# Patient Record
Sex: Male | Born: 1965
Health system: Southern US, Community
[De-identification: ages and names within clinical notes are randomized; demographics above are authoritative.]

## PROBLEM LIST (undated history)

## (undated) ENCOUNTER — Ambulatory Visit

## (undated) ENCOUNTER — Encounter

## (undated) DIAGNOSIS — M1990 Unspecified osteoarthritis, unspecified site: Secondary | ICD-10-CM

## (undated) DIAGNOSIS — F419 Anxiety disorder, unspecified: Secondary | ICD-10-CM

## (undated) DIAGNOSIS — Z9989 Dependence on other enabling machines and devices: Secondary | ICD-10-CM

## (undated) DIAGNOSIS — E785 Hyperlipidemia, unspecified: Secondary | ICD-10-CM

## (undated) DIAGNOSIS — G4733 Obstructive sleep apnea (adult) (pediatric): Secondary | ICD-10-CM

## (undated) DIAGNOSIS — T7840XA Allergy, unspecified, initial encounter: Secondary | ICD-10-CM

## (undated) HISTORY — DX: Unspecified osteoarthritis, unspecified site: M19.90

## (undated) HISTORY — DX: Dependence on other enabling machines and devices: Z99.89

## (undated) HISTORY — DX: Anxiety disorder, unspecified: F41.9

## (undated) HISTORY — DX: Hyperlipidemia, unspecified: E78.5

## (undated) HISTORY — DX: Allergy, unspecified, initial encounter: T78.40XA

## (undated) HISTORY — DX: Obstructive sleep apnea (adult) (pediatric): G47.33

## (undated) HISTORY — PX: VASECTOMY: SHX75

---
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
## 2011-11-28 ENCOUNTER — Other Ambulatory Visit: Payer: Self-pay | Admitting: Family Medicine

## 2011-11-28 MED ORDER — ATORVASTATIN CALCIUM 40 MG PO TABS: 40.0000 mg | ORAL_TABLET | Freq: Every day | ORAL | Status: DC

## 2011-12-01 ENCOUNTER — Other Ambulatory Visit: Payer: Self-pay | Admitting: *Deleted

## 2011-12-01 MED ORDER — ATORVASTATIN CALCIUM 40 MG PO TABS: 40.0000 mg | ORAL_TABLET | Freq: Every day | ORAL | Status: DC

## 2012-01-04 ENCOUNTER — Other Ambulatory Visit: Payer: Self-pay | Admitting: Emergency Medicine

## 2012-02-01 ENCOUNTER — Other Ambulatory Visit: Payer: Self-pay | Admitting: Physician Assistant

## 2012-06-12 ENCOUNTER — Ambulatory Visit (INDEPENDENT_AMBULATORY_CARE_PROVIDER_SITE_OTHER): Payer: BC Managed Care – PPO | Admitting: Emergency Medicine

## 2012-06-12 ENCOUNTER — Encounter: Payer: Self-pay | Admitting: Emergency Medicine

## 2012-06-12 ENCOUNTER — Ambulatory Visit: Payer: BC Managed Care – PPO

## 2012-06-12 VITALS — BP 120/90 | HR 69 | Temp 98.0°F | Resp 16 | Ht 72.75 in | Wt 227.0 lb

## 2012-06-12 DIAGNOSIS — E785 Hyperlipidemia, unspecified: Secondary | ICD-10-CM

## 2012-06-12 DIAGNOSIS — Z79899 Other long term (current) drug therapy: Secondary | ICD-10-CM

## 2012-06-12 DIAGNOSIS — Z125 Encounter for screening for malignant neoplasm of prostate: Secondary | ICD-10-CM

## 2012-06-12 DIAGNOSIS — S3994XA Unspecified injury of external genitals, initial encounter: Secondary | ICD-10-CM

## 2012-06-12 DIAGNOSIS — Z23 Encounter for immunization: Secondary | ICD-10-CM

## 2012-06-12 DIAGNOSIS — Z0000 Encounter for general adult medical examination without abnormal findings: Secondary | ICD-10-CM

## 2012-06-12 DIAGNOSIS — S3993XA Unspecified injury of pelvis, initial encounter: Secondary | ICD-10-CM

## 2012-06-12 DIAGNOSIS — S39848A Other specified injuries of external genitals, initial encounter: Secondary | ICD-10-CM

## 2012-06-12 LAB — COMPREHENSIVE METABOLIC PANEL
AST: 23 U/L (ref 0–37)
Albumin: 4.9 g/dL (ref 3.5–5.2)
Alkaline Phosphatase: 73 U/L (ref 39–117)
BUN: 16 mg/dL (ref 6–23)
Creat: 1.02 mg/dL (ref 0.50–1.35)
Potassium: 4.3 mEq/L (ref 3.5–5.3)

## 2012-06-12 LAB — LIPID PANEL
HDL: 37 mg/dL — ABNORMAL LOW (ref >39)
LDL Cholesterol: 96 mg/dL (ref 0–99)
Total CHOL/HDL Ratio: 4.3 Ratio
Triglycerides: 134 mg/dL (ref <150)
VLDL: 27 mg/dL (ref 0–40)

## 2012-06-12 LAB — POCT URINALYSIS DIPSTICK
Bilirubin, UA: NEGATIVE
Ketones, UA: NEGATIVE
Leukocytes, UA: NEGATIVE
Protein, UA: NEGATIVE

## 2012-06-12 LAB — POCT UA - MICROSCOPIC ONLY
Crystals, Ur, HPF, POC: NEGATIVE
WBC, Ur, HPF, POC: NEGATIVE

## 2012-06-12 LAB — CBC
HCT: 46.5 % (ref 39.0–52.0)
MCHC: 34.8 g/dL (ref 30.0–36.0)
RDW: 13.4 % (ref 11.5–15.5)

## 2012-06-12 LAB — IFOBT (OCCULT BLOOD): IFOBT: NEGATIVE

## 2012-06-12 NOTE — Progress Notes (Signed)
@[facility] @  Patient ID: Stephen Obrien MRN: [id number], DOB: 09/15/1965 46 y.o. Date of Encounter: 06/12/2012, 8:01 AM  Primary Physician: No primary provider on file.  Chief Complaint: Physical (CPE)  HPI: 46 y.o. y/o male with history noted below here for CPE.  Doing well. No issues/complaints.  Review of Systems:  Consitutional: No fever, chills, fatigue, night sweats, lymphadenopathy, or weight changes. Eyes: No visual changes, eye redness, or discharge. ENT/Mouth: Ears: No otalgia, tinnitus, hearing loss, discharge. Nose: No congestion, rhinorrhea, sinus pain, or epistaxis. Throat: No sore throat, post nasal drip, or teeth pain. Cardiovascular: No CP, palpitations, diaphoresis, DOE, edema, orthopnea, PND. Respiratory: No cough, hemoptysis, SOB, or wheezing. Gastrointestinal: No anorexia, dysphagia, reflux, pain, nausea, vomiting, hematemesis, diarrhea, constipation, BRBPR, or melena. Genitourinary: No dysuria, frequency, urgency, hematuria, incontinence, nocturia, decreased urinary stream, discharge, impotence, or testicular pain/masses. Musculoskeletal: He has persistent pain in his right  groin following a soccer injury one year ago. He is limited in his activity but he can bicycle but just cannot run   Skin: No rash, erythema, lesion changes, pain, warmth, jaundice, or pruritis. Neurological: No headache, dizziness, syncope, seizures, tremors, memory loss, coordination problems, or paresthesias. Psychological: No anxiety, depression, hallucinations, SI/HI. Endocrine: No fatigue, polydipsia, polyphagia, polyuria, or known diabetes. All other systems were reviewed and are otherwise negative.  No past medical history on file.   No past surgical history on file.  Home Meds:  Prior to Admission medications   Medication Sig Start Date End Date Taking? Authorizing Provider  atorvastatin (LIPITOR) 40 MG tablet Take 1 tablet (40 mg total) by mouth daily. 12/01/11  Yes Anders Simmonds, PA-C  cetirizine (ZYRTEC) 5 MG tablet Take 5 mg by mouth daily.   Yes Historical Provider, MD  fexofenadine (ALLEGRA) 180 MG tablet Take 180 mg by mouth daily.   Yes Historical Provider, MD  fish oil-omega-3 fatty acids 1000 MG capsule Take 2 g by mouth daily.   Yes Historical Provider, MD  glucosamine-chondroitin 500-400 MG tablet Take 1 tablet by mouth 3 (three) times daily.   Yes Historical Provider, MD  zolpidem (AMBIEN) 10 MG tablet TAKE 1 TABLET BY MOUTH AT BEDTIME 02/01/12  Yes Ryan M Dunn, PA-C    Allergies: No Known Allergies  History   Social History  . Marital Status: Married    Spouse Name: N/A    Number of Children: N/A  . Years of Education: N/A   Occupational History  . Not on file.   Social History Main Topics  . Smoking status: Never Smoker   . Smokeless tobacco: Not on file  . Alcohol Use: Yes  . Drug Use: No  . Sexually Active: Not on file   Other Topics Concern  . Not on file   Social History Narrative  . No narrative on file    No family history on file.  Physical Exam:  Blood pressure 120/90, pulse 69, temperature 98 F (36.7 C), temperature source Oral, resp. rate 16, height 6' 0.75" (1.848 m), weight 227 lb (102.967 kg), SpO2 97.00%.  General: Well developed, well nourished, in no acute distress. HEENT: Normocephalic, atraumatic. Conjunctiva pink, sclera non-icteric. Pupils 2 mm constricting to 1 mm, round, regular, and equally reactive to light and accomodation. EOMI. Internal auditory canal clear. TMs with good cone of light and without pathology. Nasal mucosa pink. Nares are without discharge. No sinus tenderness. Oral mucosa pink. Dentition normal. Pharynx without exudate.   Neck: Supple. Trachea midline. No thyromegaly. Full ROM. No lymphadenopathy.  Lungs: Clear to auscultation bilaterally without wheezes, rales, or rhonchi. Breathing is of normal effort and unlabored. Cardiovascular: RRR with S1 S2. No murmurs, rubs, or gallops  appreciated. Distal pulses 2+ symmetrically. No carotid or abdominal bruits. Abdomen: Soft, non-tender, non-distended with normoactive bowel sounds. No hepatosplenomegaly or masses. No rebound/guarding. No CVA tenderness. Without hernias.  Rectal: No external hemorrhoids or fissures. Rectal vault without masses. Prostate exam is normal  Genitourinary:  circumcised male. No penile lesions. Testes descended bilaterally, and smooth without tenderness or masses.  Musculoskeletal: Full range of motion and 5/5 strength throughout. Without swelling, atrophy, tenderness, crepitus, or warmth. Extremities without clubbing, cyanosis, or edema. Calves supple. Skin: Warm and moist without erythema, ecchymosis, wounds, or rash. Neuro: A+Ox3. CN II-XII grossly intact. Moves all extremities spontaneously. Full sensation throughout. Normal gait. DTR 2+ throughout upper and lower extremities. Finger to nose intact. Psych:  Responds to questions appropriately with a normal affect.  [facility] reading (PRIMARY) by  Dr.Tannie Koskela is a radiolucent area through the head of the femur but the patient's injury was one year ago and he has normal range of motion of the right hip I do not see any other abnormal areas EKG   Studies: CBC, CMET, Lipid, PSA, TSH, EKG and pelvic films   Results for orders placed in visit on 06/12/12  POCT UA - MICROSCOPIC ONLY      Component Value Range   WBC, Ur, HPF, POC neg     RBC, urine, microscopic 0-4     Bacteria, U Microscopic neg     Mucus, UA trace     Epithelial cells, urine per micros 0-1     Crystals, Ur, HPF, POC neg     Casts, Ur, LPF, POC neg     Yeast, UA neg    POCT URINALYSIS DIPSTICK      Component Value Range   Color, UA yellow     Clarity, UA clear     Glucose, UA neg     Bilirubin, UA neg     Ketones, UA neg     Spec Grav, UA 1.025     Blood, UA small     pH, UA 5.5     Protein, UA neg     Urobilinogen, UA 0.2     Nitrite, UA neg     Leukocytes, UA Negative    IFOBT  (OCCULT BLOOD)      Component Value Range   IFOBT Negative      Assessment/Plan:  46 y.o. white male in good health here for physical exam. Last year he saw Dr. Jacinto Halim and had a good cardiovascular workup. We'll check routine labs. Also check films of his pelvis. Okay to refill medications if patient calls. He will see how he does with reinstituting yoga and if he has problems would advise referral to physical therapy. Flu shot was given.      -  Signed, Earl Lites, MD 06/12/2012 8:01 AM

## 2012-06-13 ENCOUNTER — Telehealth: Payer: Self-pay | Admitting: Emergency Medicine

## 2012-06-21 NOTE — Telephone Encounter (Signed)
Erroneous encounter

## 2012-07-22 ENCOUNTER — Other Ambulatory Visit: Payer: Self-pay | Admitting: Physician Assistant

## 2012-07-24 ENCOUNTER — Encounter: Payer: Self-pay | Admitting: Emergency Medicine

## 2012-08-20 ENCOUNTER — Other Ambulatory Visit: Payer: Self-pay | Admitting: Physician Assistant

## 2012-09-27 ENCOUNTER — Telehealth: Payer: Self-pay

## 2012-09-27 NOTE — Telephone Encounter (Signed)
Pt needs refill on prescription Lipitor . [facility] [phone] please let pt know when done

## 2012-09-28 MED ORDER — ATORVASTATIN CALCIUM 40 MG PO TABS: 40.0000 mg | ORAL_TABLET | Freq: Every day | ORAL | Status: DC

## 2012-09-28 NOTE — Telephone Encounter (Signed)
Called pt to advise this is done

## 2013-01-22 ENCOUNTER — Other Ambulatory Visit: Payer: Self-pay | Admitting: Physician Assistant

## 2013-01-22 ENCOUNTER — Other Ambulatory Visit: Payer: Self-pay | Admitting: Emergency Medicine

## 2013-01-24 ENCOUNTER — Other Ambulatory Visit: Payer: Self-pay | Admitting: Radiology

## 2013-01-24 NOTE — Telephone Encounter (Signed)
Called in the [place], Dr Cleta Alberts printed he is not here to sign

## 2013-05-27 ENCOUNTER — Other Ambulatory Visit: Payer: Self-pay | Admitting: Emergency Medicine

## 2013-05-27 NOTE — Telephone Encounter (Signed)
Pt has appt on 06/18/13. Do you want to RF until then? 1 mos pended.

## 2013-06-15 ENCOUNTER — Other Ambulatory Visit: Payer: Self-pay | Admitting: Physician Assistant

## 2013-06-18 ENCOUNTER — Encounter: Payer: BC Managed Care – PPO | Admitting: Emergency Medicine

## 2013-07-24 ENCOUNTER — Other Ambulatory Visit: Payer: Self-pay | Admitting: Physician Assistant

## 2013-07-25 NOTE — Telephone Encounter (Signed)
Pt has appt 09/03/13. Can we RF until then?

## 2013-09-03 ENCOUNTER — Encounter: Payer: BC Managed Care – PPO | Admitting: Emergency Medicine

## 2013-09-06 ENCOUNTER — Other Ambulatory Visit: Payer: Self-pay | Admitting: Emergency Medicine

## 2013-09-06 ENCOUNTER — Encounter: Payer: Self-pay | Admitting: Emergency Medicine

## 2013-09-06 ENCOUNTER — Ambulatory Visit (INDEPENDENT_AMBULATORY_CARE_PROVIDER_SITE_OTHER): Payer: BC Managed Care – PPO | Admitting: Emergency Medicine

## 2013-09-06 VITALS — BP 120/70 | HR 100 | Temp 98.0°F | Resp 16 | Ht 72.5 in | Wt 237.0 lb

## 2013-09-06 DIAGNOSIS — R1031 Right lower quadrant pain: Secondary | ICD-10-CM

## 2013-09-06 DIAGNOSIS — Z0000 Encounter for general adult medical examination without abnormal findings: Secondary | ICD-10-CM

## 2013-09-06 DIAGNOSIS — Z1211 Encounter for screening for malignant neoplasm of colon: Secondary | ICD-10-CM

## 2013-09-06 DIAGNOSIS — Z23 Encounter for immunization: Secondary | ICD-10-CM

## 2013-09-06 DIAGNOSIS — E785 Hyperlipidemia, unspecified: Secondary | ICD-10-CM | POA: Insufficient documentation

## 2013-09-06 DIAGNOSIS — E789 Disorder of lipoprotein metabolism, unspecified: Secondary | ICD-10-CM

## 2013-09-06 DIAGNOSIS — R748 Abnormal levels of other serum enzymes: Secondary | ICD-10-CM

## 2013-09-06 DIAGNOSIS — L500 Allergic urticaria: Secondary | ICD-10-CM | POA: Insufficient documentation

## 2013-09-06 LAB — CBC WITH DIFFERENTIAL/PLATELET
BASOS PCT: 0 % (ref 0–1)
Basophils Absolute: 0 10*3/uL (ref 0.0–0.1)
Eosinophils Absolute: 0.1 10*3/uL (ref 0.0–0.7)
Eosinophils Relative: 2 % (ref 0–5)
HCT: 50.3 % (ref 39.0–52.0)
Hemoglobin: 17.4 g/dL — ABNORMAL HIGH (ref 13.0–17.0)
Lymphocytes Relative: 42 % (ref 12–46)
Lymphs Abs: 2.3 10*3/uL (ref 0.7–4.0)
MCH: 30.2 pg (ref 26.0–34.0)
MCHC: 34.6 g/dL (ref 30.0–36.0)
MCV: 87.2 fL (ref 78.0–100.0)
MONO ABS: 0.7 10*3/uL (ref 0.1–1.0)
Monocytes Relative: 13 % — ABNORMAL HIGH (ref 3–12)
NEUTROS ABS: 2.3 10*3/uL (ref 1.7–7.7)
NEUTROS PCT: 43 % (ref 43–77)
PLATELETS: 254 10*3/uL (ref 150–400)
RBC: 5.77 MIL/uL (ref 4.22–5.81)
RDW: 13.2 % (ref 11.5–15.5)
WBC: 5.4 10*3/uL (ref 4.0–10.5)

## 2013-09-06 LAB — POCT URINALYSIS DIPSTICK
BILIRUBIN UA: NEGATIVE
Blood, UA: NEGATIVE
Glucose, UA: NEGATIVE
KETONES UA: NEGATIVE
LEUKOCYTES UA: NEGATIVE
Nitrite, UA: NEGATIVE
PROTEIN UA: NEGATIVE
SPEC GRAV UA: 1.01
Urobilinogen, UA: 0.2
pH, UA: 6.5

## 2013-09-06 LAB — LIPID PANEL
Cholesterol: 159 mg/dL (ref 0–200)
HDL: 36 mg/dL — AB (ref >39)
LDL Cholesterol: 102 mg/dL — ABNORMAL HIGH (ref 0–99)
Total CHOL/HDL Ratio: 4.4 Ratio
Triglycerides: 107 mg/dL (ref <150)
VLDL: 21 mg/dL (ref 0–40)

## 2013-09-06 LAB — COMPLETE METABOLIC PANEL WITH GFR
ALT: 66 U/L — ABNORMAL HIGH (ref 0–53)
AST: 31 U/L (ref 0–37)
Albumin: 5 g/dL (ref 3.5–5.2)
Alkaline Phosphatase: 80 U/L (ref 39–117)
BUN: 16 mg/dL (ref 6–23)
CO2: 25 mEq/L (ref 19–32)
Calcium: 9.9 mg/dL (ref 8.4–10.5)
Chloride: 104 mEq/L (ref 96–112)
Creat: 1.06 mg/dL (ref 0.50–1.35)
GFR, Est African American: >89 mL/min
GFR, Est Non African American: 83 mL/min
Glucose, Bld: 96 mg/dL (ref 70–99)
Potassium: 4.2 mEq/L (ref 3.5–5.3)
SODIUM: 138 meq/L (ref 135–145)
Total Bilirubin: 0.5 mg/dL (ref 0.2–1.2)
Total Protein: 7.6 g/dL (ref 6.0–8.3)

## 2013-09-06 LAB — PSA: PSA: 0.81 ng/mL (ref <=4.00)

## 2013-09-06 LAB — IFOBT (OCCULT BLOOD): IFOBT: POSITIVE

## 2013-09-06 LAB — TSH: TSH: 1.439 u[IU]/mL (ref 0.350–4.500)

## 2013-09-06 MED ORDER — FEXOFENADINE HCL 180 MG PO TABS: 180.0000 mg | ORAL_TABLET | Freq: Every day | ORAL | Status: DC

## 2013-09-06 MED ORDER — ZOLPIDEM TARTRATE 10 MG PO TABS: ORAL_TABLET | ORAL | Status: DC

## 2013-09-06 MED ORDER — LEVOCETIRIZINE DIHYDROCHLORIDE 5 MG PO TABS: 5.0000 mg | ORAL_TABLET | Freq: Every evening | ORAL | Status: DC

## 2013-09-06 MED ORDER — ATORVASTATIN CALCIUM 40 MG PO TABS: ORAL_TABLET | ORAL | Status: DC

## 2013-09-06 NOTE — Progress Notes (Signed)
@[facility] @  Patient ID: Stephen Obrien MRN: [id number], DOB: 10-29-1965 48 y.o. Date of Encounter: 09/06/2013, 10:17 AM  Primary Physician: Jenny Reichmann, MD  Chief Complaint: Physical (CPE)  HPI: 48 y.o. y/o male with history noted below here for CPE.  Doing well. Feeling pretty good.  Still has numbness in left side.  Still having chronic hives but takes allegra every morning.  Hives has been going on for ten years.  Contact pressure hives.  Sleep is still an issue.  Will take an [place] once or twice week.  Doesn't think he has sleep apnea.  Family history of cholesterol, stroke in great maternal grandparents.  Exercises on a bike once or twice a week.  Walks and carried bag when golfing.  Still having issues and would like to be referred to ortho.  Left ear is popping.    Review of Systems: Consitutional: No fever, chills, fatigue, night sweats, lymphadenopathy, or weight changes. Eyes: No visual changes, eye redness, or discharge. ENT/Mouth: Ears: No otalgia, tinnitus, hearing loss, discharge. Nose: No congestion, rhinorrhea, sinus pain, or epistaxis. Throat: No sore throat, post nasal drip, or teeth pain. Cardiovascular: No CP, palpitations, diaphoresis, DOE, edema, orthopnea, PND. Respiratory: No cough, hemoptysis, SOB, or wheezing. Gastrointestinal: No anorexia, dysphagia, reflux, pain, nausea, vomiting, hematemesis, diarrhea, constipation, BRBPR, or melena. Genitourinary: No dysuria, frequency, urgency, hematuria, incontinence, nocturia, decreased urinary stream, discharge, impotence, or testicular pain/masses. Musculoskeletal: No decreased ROM, myalgias, stiffness, joint swelling, or weakness. Skin: No rash, erythema, lesion changes, pain, warmth, jaundice, or pruritis.  Has an issue with chronic hives Neurological: No headache, dizziness, syncope, seizures, tremors, memory loss, coordination problems, or paresthesias. Psychological: No anxiety, depression, hallucinations,  SI/HI. Endocrine: No fatigue, polydipsia, polyphagia, polyuria, or known diabetes. All other systems were reviewed and are otherwise negative. Has seasonal allergies and minor food allergies.  Also has sleep disturbance.   Past Medical History  Diagnosis Date  . Allergy   . Arthritis      Past Surgical History  Procedure Laterality Date  . Vasectomy      Home Meds:  Prior to Admission medications   Medication Sig Start Date End Date Taking? Authorizing Provider  atorvastatin (LIPITOR) 40 MG tablet TAKE 1 TABLET BY MOUTH DAILY 07/24/13  Yes Ryan M Dunn, PA-C  fexofenadine (ALLEGRA) 180 MG tablet Take 180 mg by mouth daily.   Yes Historical Provider, MD  fish oil-omega-3 fatty acids 1000 MG capsule Take 2 g by mouth daily.   Yes Historical Provider, MD  glucosamine-chondroitin 500-400 MG tablet Take 1 tablet by mouth 3 (three) times daily.   Yes Historical Provider, MD  levocetirizine (XYZAL) 5 MG tablet Take 5 mg by mouth every evening.   Yes Historical Provider, MD  zolpidem (AMBIEN) 10 MG tablet TAKE 1 TABLET BY MOUTH EVERY NIGHT AT BEDTIME AS NEEDED 05/27/13  Yes [name], MD  cetirizine (ZYRTEC) 5 MG tablet Take 5 mg by mouth daily.    Historical Provider, MD    Allergies: No Known Allergies  History   Social History  . Marital Status: Married    Spouse Name: N/A    Number of Children: N/A  . Years of Education: N/A   Occupational History  . Not on file.   Social History Main Topics  . Smoking status: Never Smoker   . Smokeless tobacco: Not on file  . Alcohol Use: Yes  . Drug Use: No  . Sexual Activity: Not on file   Other Topics Concern  .  Not on file   Social History Narrative  . No narrative on file    Family History  Problem Relation Age of Onset  . Stroke Father   . Depression Sister     Physical Exam: Blood pressure 120/70, pulse 100, temperature 98 F (36.7 C), temperature source Oral, resp. rate 16, height 6' 0.5" (1.842 m), weight 237 lb  (107.502 kg), SpO2 98.00%.  General: Well developed, well nourished, in no acute distress. HEENT: Normocephalic, atraumatic. Conjunctiva pink, sclera non-icteric. Pupils 2 mm constricting to 1 mm, round, regular, and equally reactive to light and accomodation. EOMI. Internal auditory canal clear. TMs with good cone of light and without pathology. Nasal mucosa pink. Nares are without discharge. No sinus tenderness. Oral mucosa pink. Dentition. Pharynx without exudate.   Neck: Supple. Trachea midline. No thyromegaly. Full ROM. No lymphadenopathy. Lungs: Clear to auscultation bilaterally without wheezes, rales, or rhonchi. Breathing is of normal effort and unlabored. Cardiovascular: RRR with S1 S2. No murmurs, rubs, or gallops appreciated. Distal pulses 2+ symmetrically. No carotid or abdominal bruits. Abdomen: Soft, non-tender, non-distended with normoactive bowel sounds. No hepatosplenomegaly or masses. No rebound/guarding. No CVA tenderness. Without hernias.  Rectal: No external hemorrhoids or fissures. Rectal vault without masses.  Genitourinary:  circumcised male. No penile lesions. Testes descended bilaterally, and smooth without tenderness or masses.  Musculoskeletal: Full range of motion and 5/5 strength throughout. Without swelling, atrophy, tenderness, crepitus, or warmth. Extremities without clubbing, cyanosis, or edema. Calves supple. Skin: Warm and moist without erythema, ecchymosis, wounds, or rash. Neuro: A+Ox3. CN II-XII grossly intact. Moves all extremities spontaneously. Full sensation throughout. Normal gait. DTR 2+ throughout upper and lower extremities. Finger to nose intact. Psych:  Responds to questions appropriately with a normal affect.   Meds ordered this encounter  Medications  . DISCONTD: levocetirizine (XYZAL) 5 MG tablet    Sig: Take 5 mg by mouth every evening.  . zolpidem (AMBIEN) 10 MG tablet    Sig: TAKE 1 TABLET BY MOUTH EVERY NIGHT AT BEDTIME AS NEEDED     Dispense:  30 tablet    Refill:  5  . levocetirizine (XYZAL) 5 MG tablet    Sig: Take 1 tablet (5 mg total) by mouth every evening.    Dispense:  30 tablet    Refill:  11  . fexofenadine (ALLEGRA) 180 MG tablet    Sig: Take 1 tablet (180 mg total) by mouth daily.    Dispense:  30 tablet    Refill:  11  . atorvastatin (LIPITOR) 40 MG tablet    Sig: TAKE 1 TABLET BY MOUTH DAILY    Dispense:  30 tablet    Refill:  11   Assessment/Plan:  48 y.o. y/o enters for general checkup. He is actually doing very well. 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 Meds were refilled .  -  Signed, Nena Jordan, MD 09/06/2013 10:17 AM

## 2013-09-09 LAB — HEPATITIS C ANTIBODY: HCV Ab: NEGATIVE

## 2013-10-16 ENCOUNTER — Ambulatory Visit (INDEPENDENT_AMBULATORY_CARE_PROVIDER_SITE_OTHER): Payer: BC Managed Care – PPO | Admitting: Family Medicine

## 2013-10-16 VITALS — BP 118/78 | HR 81 | Temp 98.0°F | Resp 16 | Ht 72.75 in | Wt 232.0 lb

## 2013-10-16 DIAGNOSIS — M79609 Pain in unspecified limb: Secondary | ICD-10-CM

## 2013-10-16 DIAGNOSIS — S86809A Unspecified injury of other muscle(s) and tendon(s) at lower leg level, unspecified leg, initial encounter: Secondary | ICD-10-CM

## 2013-10-16 NOTE — Progress Notes (Signed)
° °  Subjective:    Patient ID: Stephen Obrien, male    DOB: 11-20-1965, 48 y.o.   MRN: [id number]  This chart was scribed for Robyn Haber, MD by Maree Erie, ED Scribe.   Chief Complaint  Patient presents with   possible muscle strain    right leg    PCP: DAUB, STEVE A, MD   HPI  Stephen Obrien is a 48 y.o. male who presents to office complaining of sudden onset right calf pain that occurred last night. 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 He states that on his first step he felt a pop and describes the sensation as a cork coming out of a champagne bottle. He has been unable to walk without significant pain to the right calf. He states that he iced the area with six hours last night and rested with mild to moderate improvement on the pain today. He is still unable to put bear weight normally on the right leg and is favoring the leg while walking. He denies noticing any bruising to the area.   His daughter is eleven currently. His mother was head nurse in the Labor and Delivery section.    Past Medical History  Diagnosis Date   Allergy    Arthritis     Review of Systems  Consitutional: No fever, chills, fatigue, night sweats, lymphadenopathy, or weight changes. Eyes: No visual changes, eye redness, or discharge. ENT/Mouth: Ears: No otalgia, tinnitus, hearing loss, discharge. Nose: No congestion, rhinorrhea, sinus pain, or epistaxis. Throat: No sore throat, post nasal drip, or teeth pain. Cardiovascular: No CP, palpitations, diaphoresis, DOE, edema, orthopnea, PND. Respiratory: No cough, hemoptysis, SOB, or wheezing. Gastrointestinal: No anorexia, dysphagia, reflux, pain, nausea, vomiting, hematemesis, diarrhea, constipation, BRBPR, or melena. Genitourinary: No dysuria, frequency, urgency, hematuria, incontinence, nocturia, decreased urinary stream, discharge, impotence, or testicular pain/masses. Musculoskeletal: Positive for right  calf pain, swelling Skin: No rash, erythema, lesion changes, pain, warmth, jaundice, or pruritis. Neurological: No headache, dizziness, syncope, seizures, tremors, memory loss, coordination problems, or paresthesias. Psychological: No anxiety, depression, hallucinations, SI/HI. Endocrine: No fatigue, polydipsia, polyphagia or polyuria. All other systems were reviewed and are otherwise negative.     Objective:   Physical Exam BP 118/78   Pulse 81   Temp(Src) 98 F (36.7 C) (Oral)   Resp 16   Ht 6' 0.75" (1.848 m)   Wt 232 lb (105.235 kg)   BMI 30.81 kg/m2   SpO2 95%  General: Well-developed, well-nourished male in no acute distress; appearance consistent with age of record HENT: normocephalic; atraumatic Eyes: pupils equal, round and reactive to light; extraocular muscles intact Neck: supple Heart: regular rate and rhythm; no murmurs, rubs or gallops Lungs: clear to auscultation bilaterally Abdomen: soft; nondistended; nontender; no masses or hepatosplenomegaly; bowel sounds present Extremities: No deformity; full range of motion; pulses normal Neurologic: Awake, alert and oriented; motor function intact in all extremities and symmetric; no facial droop Skin: Warm and dry Psychiatric: Normal mood and affect  Patient has a swollen right calf with tenderness on palpation. He has an antalgic gait but is able to bear weight     Assessment & Plan:   I personally performed the services described in this documentation, which was scribed in my presence. The recorded information has been reviewed and is accurate.  Injury of plantaris muscle or tendon Limited weight bearing x 4-6 weeks Signed, Robyn Haber, MD

## 2013-10-16 NOTE — Patient Instructions (Signed)

## 2013-12-10 ENCOUNTER — Other Ambulatory Visit (INDEPENDENT_AMBULATORY_CARE_PROVIDER_SITE_OTHER): Payer: BC Managed Care – PPO | Admitting: Radiology

## 2013-12-10 DIAGNOSIS — D751 Secondary polycythemia: Secondary | ICD-10-CM

## 2013-12-10 DIAGNOSIS — D45 Polycythemia vera: Secondary | ICD-10-CM

## 2013-12-10 LAB — HEPATIC FUNCTION PANEL
ALT: 33 U/L (ref 0–53)
AST: 21 U/L (ref 0–37)
Albumin: 4.7 g/dL (ref 3.5–5.2)
Alkaline Phosphatase: 73 U/L (ref 39–117)
BILIRUBIN INDIRECT: 0.6 mg/dL (ref 0.2–1.2)
BILIRUBIN TOTAL: 0.7 mg/dL (ref 0.2–1.2)
Bilirubin, Direct: 0.1 mg/dL (ref 0.0–0.3)
Total Protein: 7 g/dL (ref 6.0–8.3)

## 2013-12-10 LAB — CBC WITH DIFFERENTIAL/PLATELET
BASOS PCT: 0 % (ref 0–1)
Basophils Absolute: 0 10*3/uL (ref 0.0–0.1)
Eosinophils Absolute: 0 10*3/uL (ref 0.0–0.7)
Eosinophils Relative: 1 % (ref 0–5)
HEMATOCRIT: 44.9 % (ref 39.0–52.0)
Hemoglobin: 15.6 g/dL (ref 13.0–17.0)
LYMPHS ABS: 1.6 10*3/uL (ref 0.7–4.0)
Lymphocytes Relative: 35 % (ref 12–46)
MCH: 29.7 pg (ref 26.0–34.0)
MCHC: 34.7 g/dL (ref 30.0–36.0)
MCV: 85.5 fL (ref 78.0–100.0)
MONO ABS: 0.6 10*3/uL (ref 0.1–1.0)
MONOS PCT: 12 % (ref 3–12)
NEUTROS ABS: 2.4 10*3/uL (ref 1.7–7.7)
Neutrophils Relative %: 52 % (ref 43–77)
Platelets: 243 10*3/uL (ref 150–400)
RBC: 5.25 MIL/uL (ref 4.22–5.81)
RDW: 13 % (ref 11.5–15.5)
WBC: 4.7 10*3/uL (ref 4.0–10.5)

## 2013-12-10 LAB — SEDIMENTATION RATE: Sed Rate: 4 mm/hr (ref 0–16)

## 2013-12-11 ENCOUNTER — Telehealth: Payer: Self-pay | Admitting: *Deleted

## 2013-12-11 NOTE — Telephone Encounter (Signed)
Pt calling back in regards to lab results. Made him aware of results ; he expressed understanding .

## 2014-05-17 ENCOUNTER — Other Ambulatory Visit: Payer: Self-pay | Admitting: Emergency Medicine

## 2014-05-19 NOTE — Telephone Encounter (Signed)
Faxed

## 2014-07-23 ENCOUNTER — Other Ambulatory Visit: Payer: Self-pay | Admitting: Emergency Medicine

## 2014-09-07 ENCOUNTER — Other Ambulatory Visit: Payer: Self-pay | Admitting: Emergency Medicine

## 2014-09-09 ENCOUNTER — Encounter: Payer: Self-pay | Admitting: Emergency Medicine

## 2014-09-09 ENCOUNTER — Ambulatory Visit (INDEPENDENT_AMBULATORY_CARE_PROVIDER_SITE_OTHER): Payer: BLUE CROSS/BLUE SHIELD | Admitting: Emergency Medicine

## 2014-09-09 VITALS — BP 116/81 | HR 75 | Temp 98.0°F | Resp 16 | Ht 73.5 in | Wt 234.0 lb

## 2014-09-09 DIAGNOSIS — Z0000 Encounter for general adult medical examination without abnormal findings: Secondary | ICD-10-CM

## 2014-09-09 DIAGNOSIS — Z23 Encounter for immunization: Secondary | ICD-10-CM

## 2014-09-09 LAB — CBC WITH DIFFERENTIAL/PLATELET
BASOS ABS: 0 10*3/uL (ref 0.0–0.1)
Basophils Relative: 0 % (ref 0–1)
EOS ABS: 0.1 10*3/uL (ref 0.0–0.7)
EOS PCT: 2 % (ref 0–5)
HCT: 46.9 % (ref 39.0–52.0)
HEMOGLOBIN: 16.1 g/dL (ref 13.0–17.0)
LYMPHS PCT: 33 % (ref 12–46)
Lymphs Abs: 1.8 10*3/uL (ref 0.7–4.0)
MCH: 29.7 pg (ref 26.0–34.0)
MCHC: 34.3 g/dL (ref 30.0–36.0)
MCV: 86.4 fL (ref 78.0–100.0)
MONO ABS: 0.7 10*3/uL (ref 0.1–1.0)
MPV: 9.1 fL (ref 8.6–12.4)
Monocytes Relative: 13 % — ABNORMAL HIGH (ref 3–12)
Neutro Abs: 2.8 10*3/uL (ref 1.7–7.7)
Neutrophils Relative %: 52 % (ref 43–77)
Platelets: 280 10*3/uL (ref 150–400)
RBC: 5.43 MIL/uL (ref 4.22–5.81)
RDW: 13.2 % (ref 11.5–15.5)
WBC: 5.4 10*3/uL (ref 4.0–10.5)

## 2014-09-09 LAB — POCT URINALYSIS DIPSTICK
Bilirubin, UA: NEGATIVE
Blood, UA: NEGATIVE
GLUCOSE UA: NEGATIVE
Ketones, UA: NEGATIVE
LEUKOCYTES UA: NEGATIVE
Nitrite, UA: NEGATIVE
PROTEIN UA: NEGATIVE
Spec Grav, UA: <=1.005
UROBILINOGEN UA: 0.2
pH, UA: 5.5

## 2014-09-09 LAB — LIPID PANEL
Cholesterol: 165 mg/dL (ref 0–200)
HDL: 36 mg/dL — AB (ref >=40)
LDL CALC: 108 mg/dL — AB (ref 0–99)
Total CHOL/HDL Ratio: 4.6 Ratio
Triglycerides: 106 mg/dL (ref <150)
VLDL: 21 mg/dL (ref 0–40)

## 2014-09-09 LAB — IFOBT (OCCULT BLOOD): IMMUNOLOGICAL FECAL OCCULT BLOOD TEST: NEGATIVE

## 2014-09-09 LAB — COMPLETE METABOLIC PANEL WITH GFR
ALT: 38 U/L (ref 0–53)
AST: 24 U/L (ref 0–37)
Albumin: 4.6 g/dL (ref 3.5–5.2)
Alkaline Phosphatase: 77 U/L (ref 39–117)
BILIRUBIN TOTAL: 0.8 mg/dL (ref 0.2–1.2)
BUN: 12 mg/dL (ref 6–23)
CO2: 27 mEq/L (ref 19–32)
Calcium: 9.7 mg/dL (ref 8.4–10.5)
Chloride: 103 mEq/L (ref 96–112)
Creat: 1.06 mg/dL (ref 0.50–1.35)
GFR, Est African American: >89 mL/min
GFR, Est Non African American: 82 mL/min
Glucose, Bld: 93 mg/dL (ref 70–99)
Potassium: 4.2 mEq/L (ref 3.5–5.3)
Sodium: 139 mEq/L (ref 135–145)
Total Protein: 7.3 g/dL (ref 6.0–8.3)

## 2014-09-09 LAB — POCT UA - MICROSCOPIC ONLY
BACTERIA, U MICROSCOPIC: NEGATIVE
CRYSTALS, UR, HPF, POC: NEGATIVE
Casts, Ur, LPF, POC: NEGATIVE
Mucus, UA: NEGATIVE
RBC, urine, microscopic: NEGATIVE
WBC, Ur, HPF, POC: NEGATIVE
Yeast, UA: NEGATIVE

## 2014-09-09 NOTE — Telephone Encounter (Signed)
Pt had lipid lab done on 09/09/14. Will wait for results to make sure send correct dose.

## 2014-09-09 NOTE — Progress Notes (Signed)
Subjective:  This chart was scribed for Stephen Queen, MD by Donato Schultz, Medical Scribe. This patient was seen in Room 23 and the patient's care was started at 9:35 AM.   Patient ID: Stephen Obrien, male    DOB: 1966/04/14, 49 y.o.   MRN: [id number]  HPI HPI Comments: Stephen Obrien is a 49 y.o. male with a history of hyperlipidemia and insomnia who presents to the [facility] for an annual exam.  He is currently doing physical therapy to treat a groin injury and does yoga regularly for exercise.  He also coaches soccer.    He is taking Lipitor and Xyzal daily.  He  takes monthly injections of Xolair with relief to his allergic hives and no longer uses Allegra.  He is still unsure as to what the trigger of his allergies are.  He takes Ambien once weekly.    He has an appointment with his ophthalmologist on Friday.    Past Medical History  Diagnosis Date  . Allergy   . Arthritis    Past Surgical History  Procedure Laterality Date  . Vasectomy     Family History  Problem Relation Age of Onset  . Stroke Father   . Depression Sister    History   Social History  . Marital Status: Married    Spouse Name: N/A  . Number of Children: N/A  . Years of Education: N/A   Occupational History  . Not on file.   Social History Main Topics  . Smoking status: Never Smoker   . Smokeless tobacco: Not on file  . Alcohol Use: Yes  . Drug Use: No  . Sexual Activity: Not on file   Other Topics Concern  . Not on file   Social History Narrative  . No narrative on file   No Known Allergies  Review of Systems   Objective:  Physical Exam  Constitutional: He is oriented to person, place, and time. He appears well-developed and well-nourished.  HENT:  Head: Normocephalic and atraumatic.  Right Ear: Hearing, tympanic membrane, external ear and ear canal normal.  Left Ear: Hearing, tympanic membrane, external ear and ear canal normal.  Nose: Nose normal.    Mouth/Throat: Oropharynx is clear and moist. No oropharyngeal exudate.  Eyes: Conjunctivae and EOM are normal. Pupils are equal, round, and reactive to light.  Neck: Normal range of motion. Neck supple. No thyromegaly present.  Cardiovascular: Normal rate, regular rhythm and normal heart sounds.  Exam reveals no gallop and no friction rub.   No murmur heard. Pulmonary/Chest: Effort normal and breath sounds normal. No respiratory distress. He has no wheezes. He has no rales.  Abdominal: Soft. Bowel sounds are normal. There is no tenderness.  Genitourinary: Prostate normal.  Musculoskeletal: Normal range of motion. He exhibits no edema.  Lymphadenopathy:    He has no cervical adenopathy.  Neurological: He is alert and oriented to person, place, and time.  Skin: Skin is warm and dry.  Psychiatric: He has a normal mood and affect. His behavior is normal.  Nursing note and vitals reviewed.    BP 116/81 mmHg  Pulse 75  Temp(Src) 98 F (36.7 C)  Resp 16  Ht 6' 1.5" (1.867 m)  Wt 234 lb (106.142 kg)  BMI 30.45 kg/m2  SpO2 96% Assessment & Plan:   Routine labs will be done today. He has a family history of coronary disease and so is on Lipitor. He is due to see the cardiologist next  year. He currently is on Xolair injections and doing well with his urticaria. He takes his Ambien once or twice a week. He is involved with yoga and soccer. He is doing very well. I personally performed the services described in this documentation, which was scribed in my presence. The recorded information has been reviewed and is accurate.

## 2014-09-10 ENCOUNTER — Other Ambulatory Visit: Payer: Self-pay | Admitting: Emergency Medicine

## 2014-09-10 ENCOUNTER — Encounter: Payer: Self-pay | Admitting: *Deleted

## 2014-09-10 LAB — PSA: PSA: 0.69 ng/mL (ref <=4.00)

## 2014-09-15 ENCOUNTER — Other Ambulatory Visit: Payer: Self-pay | Admitting: Emergency Medicine

## 2014-09-16 ENCOUNTER — Other Ambulatory Visit: Payer: Self-pay

## 2014-09-16 NOTE — Telephone Encounter (Signed)
Rx for Ambien 10 mg sent to pharm.

## 2015-01-02 ENCOUNTER — Other Ambulatory Visit: Payer: Self-pay

## 2015-01-02 MED ORDER — ZOLPIDEM TARTRATE 10 MG PO TABS: ORAL_TABLET | ORAL | Status: DC

## 2015-04-21 ENCOUNTER — Encounter: Payer: Self-pay | Admitting: Emergency Medicine

## 2015-06-12 ENCOUNTER — Other Ambulatory Visit: Payer: Self-pay | Admitting: Emergency Medicine

## 2015-06-13 NOTE — Telephone Encounter (Signed)
Rx faxed

## 2015-06-14 ENCOUNTER — Other Ambulatory Visit: Payer: Self-pay | Admitting: Emergency Medicine

## 2015-07-08 ENCOUNTER — Other Ambulatory Visit: Payer: Self-pay | Admitting: Emergency Medicine

## 2015-08-16 ENCOUNTER — Other Ambulatory Visit: Payer: Self-pay | Admitting: Emergency Medicine

## 2015-08-20 ENCOUNTER — Ambulatory Visit (INDEPENDENT_AMBULATORY_CARE_PROVIDER_SITE_OTHER): Payer: BLUE CROSS/BLUE SHIELD | Admitting: Physician Assistant

## 2015-08-20 VITALS — BP 120/70 | HR 106 | Temp 97.6°F | Resp 18 | Ht 74.0 in | Wt 238.0 lb

## 2015-08-20 DIAGNOSIS — R05 Cough: Secondary | ICD-10-CM

## 2015-08-20 DIAGNOSIS — R0981 Nasal congestion: Secondary | ICD-10-CM

## 2015-08-20 DIAGNOSIS — J101 Influenza due to other identified influenza virus with other respiratory manifestations: Secondary | ICD-10-CM

## 2015-08-20 DIAGNOSIS — R319 Hematuria, unspecified: Secondary | ICD-10-CM | POA: Diagnosis not present

## 2015-08-20 DIAGNOSIS — R6889 Other general symptoms and signs: Secondary | ICD-10-CM

## 2015-08-20 DIAGNOSIS — R52 Pain, unspecified: Secondary | ICD-10-CM

## 2015-08-20 DIAGNOSIS — R059 Cough, unspecified: Secondary | ICD-10-CM

## 2015-08-20 DIAGNOSIS — R5381 Other malaise: Secondary | ICD-10-CM

## 2015-08-20 LAB — POCT URINALYSIS DIP (MANUAL ENTRY)
GLUCOSE UA: NEGATIVE
LEUKOCYTES UA: NEGATIVE
NITRITE UA: NEGATIVE
PH UA: 6
Protein Ur, POC: 30 — AB
Spec Grav, UA: 1.02
Urobilinogen, UA: 1

## 2015-08-20 LAB — POC MICROSCOPIC URINALYSIS (UMFC)

## 2015-08-20 LAB — POCT INFLUENZA A/B
Influenza A, POC: POSITIVE — AB
Influenza B, POC: NEGATIVE

## 2015-08-20 MED ORDER — OSELTAMIVIR PHOSPHATE 75 MG PO CAPS: 75.0000 mg | ORAL_CAPSULE | Freq: Two times a day (BID) | ORAL | Status: AC

## 2015-08-20 NOTE — Patient Instructions (Signed)

## 2015-08-20 NOTE — Progress Notes (Signed)
[facility] [address], [place] [address] [phone]  Date:  08/20/2015   Name:  Stephen Obrien   DOB:  06-12-1966   MRN:  [id number]  PCP:  Stephen Reichmann, MD    History of Present Illness:  Stephen Obrien is a 50 y.o. male patient who presents to [facility] for chief complaint of bodyaches, chills, malaise, and nasal congestion.     2 days of body aches, chills, and malaise.  Nasal congestion.  Mostly dry cough.  No sob or dyspnea.  Mild sore throat.  He has felt his ears popping and ringing.  He has taken otc cold symptoms mucinex.   Hydrating well.    Patient Active Problem List   Diagnosis Date Noted  . Allergic urticaria 09/06/2013  . Other and unspecified hyperlipidemia 09/06/2013    Past Medical History  Diagnosis Date  . Allergy   . Arthritis     Past Surgical History  Procedure Laterality Date  . Vasectomy      Social History  Substance Use Topics  . Smoking status: Never Smoker   . Smokeless tobacco: None  . Alcohol Use: Yes    Family History  Problem Relation Age of Onset  . Stroke Father   . Depression Sister   . Alcohol abuse Sister   . Hyperlipidemia Mother   . Cancer Maternal Grandmother     leukemia  . Cancer Maternal Grandfather     lung ca  . Cancer Paternal Grandfather     mesothelioma    No Known Allergies  Medication list has been reviewed and updated.  Current Outpatient Prescriptions on File Prior to Visit  Medication Sig Dispense Refill  . atorvastatin (LIPITOR) 40 MG tablet TAKE 1 TABLET BY MOUTH EVERY DAY 30 tablet 0  . levocetirizine (XYZAL) 5 MG tablet TAKE 1 TABLET BY MOUTH EVERY EVENING 30 tablet 11  . Omalizumab (XOLAIR [place]) Inject into the skin.    Marland Kitchen zolpidem (AMBIEN) 10 MG tablet TAKE 1 TABLET BY MOUTH EVERY DAY AT BEDTIME AS NEEDED 90 tablet 0   No current facility-administered medications on file prior to visit.    ROS ROS otherwise unremarkable unless listed above.   Physical Examination: BP  120/70 mmHg  Pulse 106  Temp(Src) 97.6 F (36.4 C) (Oral)  Resp 18  Ht 6\' 2"  (1.88 m)  Wt 238 lb (107.956 kg)  BMI 30.54 kg/m2  SpO2 98% Ideal Body Weight: Weight in (lb) to have BMI = 25: 194.3  Physical Exam  Constitutional: He is oriented to person, place, and time. He appears well-developed and well-nourished. No distress.  HENT:  Head: Atraumatic.  Right Ear: Tympanic membrane, external ear and ear canal normal.  Left Ear: Tympanic membrane, external ear and ear canal normal.  Nose: Mucosal edema and rhinorrhea present. Right sinus exhibits no maxillary sinus tenderness and no frontal sinus tenderness. Left sinus exhibits no maxillary sinus tenderness and no frontal sinus tenderness.  Mouth/Throat: No uvula swelling. No oropharyngeal exudate, posterior oropharyngeal edema or posterior oropharyngeal erythema.  Eyes: Conjunctivae, EOM and lids are normal. Pupils are equal, round, and reactive to light. Right eye exhibits normal extraocular motion. Left eye exhibits normal extraocular motion.  Neck: Trachea normal and full passive range of motion without pain. No edema and no erythema present.  Cardiovascular: Normal rate.   Pulmonary/Chest: Effort normal. No respiratory distress. He has no decreased breath sounds. He has no wheezes. He has no rhonchi.  Lymphadenopathy:  Head (right side): No tonsillar, no preauricular and no posterior auricular adenopathy present.       Head (left side): Tonsillar adenopathy present. No preauricular and no posterior auricular adenopathy present.    He has no cervical adenopathy.  Neurological: He is alert and oriented to person, place, and time.  Skin: Skin is warm and dry. He is not diaphoretic.  Psychiatric: He has a normal mood and affect. His behavior is normal.   Results for orders placed or performed in visit on 08/20/15  POCT Influenza A/B  Result Value Ref Range   Influenza A, POC Positive (A) Negative   Influenza B, POC Negative  Negative  POCT urinalysis dipstick  Result Value Ref Range   Color, UA yellow yellow   Clarity, UA hazy (A) clear   Glucose, UA negative negative   Bilirubin, UA small (A) negative   Ketones, POC UA trace (5) (A) negative   Spec Grav, UA 1.020    Blood, UA small (A) negative   pH, UA 6.0    Protein Ur, POC =30 (A) negative   Urobilinogen, UA 1.0    Nitrite, UA Negative Negative   Leukocytes, UA Negative Negative  POCT Microscopic Urinalysis ([facility])  Result Value Ref Range   WBC,UR,HPF,POC None None WBC/hpf   RBC,UR,HPF,POC Few (A) None RBC/hpf   Bacteria None None, Too numerous to count   Mucus Present (A) Absent   Epithelial Cells, UR Per Microscopy Few (A) None, Too numerous to count cells/hpf     Assessment and Plan: Stephen Obrien is a 50 y.o. male who is here today for cc of nasal congestion, malaise, and body aches.  Influenza positive and within timeline.  Will start the tamiflu.  Advised hydration, and ibuprofen for pain.     Influenza A - Plan: oseltamivir (TAMIFLU) 75 MG capsule, POCT urinalysis dipstick  Body aches - Plan: POCT urinalysis dipstick  Nasal congestion - Plan: POCT Influenza A/B, POCT urinalysis dipstick  Malaise - Plan: POCT Influenza A/B  Flu-like symptoms - Plan: POCT Influenza A/B, POCT urinalysis dipstick  Cough - Plan: POCT Influenza A/B  Ivar Drape, PA-C [facility] 2/2/20177:29 PM

## 2015-09-10 ENCOUNTER — Ambulatory Visit (INDEPENDENT_AMBULATORY_CARE_PROVIDER_SITE_OTHER): Payer: BLUE CROSS/BLUE SHIELD | Admitting: Emergency Medicine

## 2015-09-10 ENCOUNTER — Encounter: Payer: Self-pay | Admitting: Emergency Medicine

## 2015-09-10 VITALS — BP 115/76 | HR 77 | Temp 97.9°F | Resp 16 | Ht 74.0 in | Wt 238.0 lb

## 2015-09-10 DIAGNOSIS — G4700 Insomnia, unspecified: Secondary | ICD-10-CM | POA: Insufficient documentation

## 2015-09-10 DIAGNOSIS — E785 Hyperlipidemia, unspecified: Secondary | ICD-10-CM | POA: Diagnosis not present

## 2015-09-10 DIAGNOSIS — Z125 Encounter for screening for malignant neoplasm of prostate: Secondary | ICD-10-CM | POA: Diagnosis not present

## 2015-09-10 DIAGNOSIS — L500 Allergic urticaria: Secondary | ICD-10-CM

## 2015-09-10 DIAGNOSIS — Z1211 Encounter for screening for malignant neoplasm of colon: Secondary | ICD-10-CM | POA: Diagnosis not present

## 2015-09-10 DIAGNOSIS — Z114 Encounter for screening for human immunodeficiency virus [HIV]: Secondary | ICD-10-CM

## 2015-09-10 DIAGNOSIS — Z23 Encounter for immunization: Secondary | ICD-10-CM | POA: Diagnosis not present

## 2015-09-10 DIAGNOSIS — Z0000 Encounter for general adult medical examination without abnormal findings: Secondary | ICD-10-CM

## 2015-09-10 DIAGNOSIS — Z1159 Encounter for screening for other viral diseases: Secondary | ICD-10-CM

## 2015-09-10 LAB — COMPLETE METABOLIC PANEL WITH GFR
ALT: 36 U/L (ref 9–46)
AST: 20 U/L (ref 10–35)
Albumin: 4.3 g/dL (ref 3.6–5.1)
Alkaline Phosphatase: 76 U/L (ref 40–115)
BUN: 11 mg/dL (ref 7–25)
CHLORIDE: 106 mmol/L (ref 98–110)
CO2: 23 mmol/L (ref 20–31)
CREATININE: 1.04 mg/dL (ref 0.70–1.33)
Calcium: 9.6 mg/dL (ref 8.6–10.3)
GFR, Est African American: >89 mL/min (ref >=60)
GFR, Est Non African American: 83 mL/min (ref >=60)
GLUCOSE: 94 mg/dL (ref 65–99)
Potassium: 4.2 mmol/L (ref 3.5–5.3)
Sodium: 142 mmol/L (ref 135–146)
Total Bilirubin: 0.7 mg/dL (ref 0.2–1.2)
Total Protein: 7.2 g/dL (ref 6.1–8.1)

## 2015-09-10 LAB — POC MICROSCOPIC URINALYSIS (UMFC): MUCUS RE: ABSENT

## 2015-09-10 LAB — CBC WITH DIFFERENTIAL/PLATELET
BASOS ABS: 0 10*3/uL (ref 0.0–0.1)
Basophils Relative: 0 % (ref 0–1)
EOS ABS: 0.1 10*3/uL (ref 0.0–0.7)
EOS PCT: 2 % (ref 0–5)
HCT: 45.3 % (ref 39.0–52.0)
Hemoglobin: 15.2 g/dL (ref 13.0–17.0)
LYMPHS PCT: 35 % (ref 12–46)
Lymphs Abs: 1.9 10*3/uL (ref 0.7–4.0)
MCH: 29.1 pg (ref 26.0–34.0)
MCHC: 33.6 g/dL (ref 30.0–36.0)
MCV: 86.6 fL (ref 78.0–100.0)
MPV: 9.2 fL (ref 8.6–12.4)
Monocytes Absolute: 0.7 10*3/uL (ref 0.1–1.0)
Monocytes Relative: 13 % — ABNORMAL HIGH (ref 3–12)
NEUTROS PCT: 50 % (ref 43–77)
Neutro Abs: 2.7 10*3/uL (ref 1.7–7.7)
PLATELETS: 294 10*3/uL (ref 150–400)
RBC: 5.23 MIL/uL (ref 4.22–5.81)
RDW: 13.3 % (ref 11.5–15.5)
WBC: 5.3 10*3/uL (ref 4.0–10.5)

## 2015-09-10 LAB — LIPID PANEL
Cholesterol: 149 mg/dL (ref 125–200)
HDL: 33 mg/dL — ABNORMAL LOW (ref >=40)
LDL Cholesterol: 95 mg/dL (ref <130)
Total CHOL/HDL Ratio: 4.5 Ratio (ref <=5.0)
Triglycerides: 107 mg/dL (ref <150)
VLDL: 21 mg/dL (ref <30)

## 2015-09-10 LAB — POCT URINALYSIS DIP (MANUAL ENTRY)
Bilirubin, UA: NEGATIVE
GLUCOSE UA: NEGATIVE
Ketones, POC UA: NEGATIVE
LEUKOCYTES UA: NEGATIVE
NITRITE UA: NEGATIVE
PROTEIN UA: NEGATIVE
Spec Grav, UA: 1.015
UROBILINOGEN UA: 0.2
pH, UA: 5.5

## 2015-09-10 LAB — HIV ANTIBODY (ROUTINE TESTING W REFLEX): HIV: NONREACTIVE

## 2015-09-10 LAB — HEPATITIS C ANTIBODY: HCV Ab: NEGATIVE

## 2015-09-10 LAB — TSH: TSH: 1.25 mIU/L (ref 0.40–4.50)

## 2015-09-10 MED ORDER — ATORVASTATIN CALCIUM 40 MG PO TABS: 40.0000 mg | ORAL_TABLET | Freq: Every day | ORAL | Status: DC

## 2015-09-10 MED ORDER — LEVOCETIRIZINE DIHYDROCHLORIDE 5 MG PO TABS: 5.0000 mg | ORAL_TABLET | Freq: Every evening | ORAL | Status: AC

## 2015-09-10 MED ORDER — ZOLPIDEM TARTRATE 10 MG PO TABS: ORAL_TABLET | ORAL | Status: DC

## 2015-09-10 NOTE — Progress Notes (Signed)
By signing my name below, I, Rawaa Al Rifaie, attest that this documentation has been prepared under the direction and in the presence of Arlyss Queen, MD.  Leandra Kern, Medical Scribe. 09/10/2015.  8:36 AM.  Chief Complaint:  Chief Complaint  Patient presents with  . Annual Exam    HPI: Stephen Obrien is a 50 y.o. male who reports to [facility] today for a routine physical exam.  Pt reports that he has been doing well. He indicates that he stays active by performing yoga and playing golf.   Flu: Pt had the flu 2 weeks ago, but he reports that he was not UTD with the flu vaccines. Pt states that he still suffers from mild symptoms of sore throat, and cough.   Insomnia:  Pt notes that he still experiences sleep disturbance. He indicates that taking Ambien gives him relief, he does not experience side effects with this. Pt complains of no symptoms of sleep apnea.   Preventatives: Pt is due to a colonoscopy. He is interested in getting a referral.  Pt had a cardiac stress test 4 years ago. He indicates that there were some abnormalities.Pt has a family history of TIA, and CVA (mother).    Vision: Pt follows up with optho once every year.   Hernia: Pt reports that his umbilical hernia is not tender. He indicates that he has never even noticed it until I remarked it.   Hx Groin injury: Pt notes that his groin injury few years ago has improved with surgery and PT, and states that he still experienced discomfort, however it does not affect his daily life.    Past Medical History  Diagnosis Date  . Allergy   . Arthritis    Past Surgical History  Procedure Laterality Date  . Vasectomy     Social History   Social History  . Marital Status: Married    Spouse Name: N/A  . Number of Children: N/A  . Years of Education: N/A   Social History Main Topics  . Smoking status: Never Smoker   . Smokeless tobacco: None  . Alcohol Use: Yes  . Drug Use: No  . Sexual Activity: Yes    Other Topics Concern  . None   Social History Narrative   Family History  Problem Relation Age of Onset  . Stroke Father   . Depression Sister   . Alcohol abuse Sister   . Hyperlipidemia Mother   . Cancer Maternal Grandmother     leukemia  . Cancer Maternal Grandfather     lung ca  . Cancer Paternal Grandfather     mesothelioma   No Known Allergies Prior to Admission medications   Medication Sig Start Date End Date Taking? Authorizing Provider  atorvastatin (LIPITOR) 40 MG tablet TAKE 1 TABLET BY MOUTH EVERY DAY 08/18/15   [name], MD  levocetirizine (XYZAL) 5 MG tablet TAKE 1 TABLET BY MOUTH EVERY EVENING 09/11/14   [name], MD  Omalizumab [name] [place]) Inject into the skin.    Historical Provider, MD  zolpidem (AMBIEN) 10 MG tablet TAKE 1 TABLET BY MOUTH EVERY DAY AT BEDTIME AS NEEDED 06/13/15   [name], MD     ROS: The patient has sleep disturbance, cough, sore throat.  He denies fevers, chills, night sweats, unintentional weight loss, chest pain, palpitations, wheezing, dyspnea on exertion, nausea, vomiting, abdominal pain, dysuria, hematuria, melena, numbness, weakness, or tingling.   All other systems have been reviewed and  were otherwise negative with the exception of those mentioned in the HPI and as above.    PHYSICAL EXAM: Filed Vitals:   09/10/15 0819  BP: 115/76  Pulse: 77  Temp: 97.9 F (36.6 C)  Resp: 16   Body mass index is 30.54 kg/(m^2).   General: Alert, no acute distress HEENT:  Normocephalic, atraumatic, oropharynx patent. Eye: Juliette Mangle [facility] Cardiovascular:  Regular rate and rhythm, no rubs murmurs or gallops.  No Carotid bruits, radial pulse intact. No pedal edema.  Respiratory: Clear to auscultation bilaterally.  No wheezes, rales, or rhonchi.  No cyanosis, no use of accessory musculature Abdominal: No organomegaly, abdomen is soft and non-tender, positive bowel sounds.  No masses. 1 cm reducible umbilical hernia.   Musculoskeletal: Gait intact. No edema, tenderness Skin: No rashes. Neurologic: Facial musculature symmetric. Psychiatric: Patient acts appropriately throughout our interaction. Lymphatic: No cervical or submandibular lymphadenopathy GU: prostate exam is normal.  LABS:    EKG/XRAY:   Primary read interpreted by Dr. [name] at [facility].   ASSESSMENT/PLAN: This patient maintains a healthy lifestyle. He is on cholesterol medication. He does not smoke. He does yoga as his form of exercise. He struggles with weight. Referrals made for colonoscopy and cardiac evaluation. He had a stress test 4 years ago which was normal. He had the flu 3 weeks ago and did receive a flu shot today. He is up-to-date on his other immunizations. Advise routine physical one year.I personally performed the services described in this documentation, which was scribed in my presence. The recorded information has been reviewed and is accurate.     Gross sideeffects, risk and benefits, and alternatives of medications d/w patient. Patient is aware that all medications have potential sideeffects and we are unable to predict every sideeffect or drug-drug interaction that may occur.  Arlyss Queen MD 09/10/2015 8:20 AM

## 2015-09-10 NOTE — Patient Instructions (Signed)

## 2015-09-11 LAB — PSA: PSA: 0.63 ng/mL (ref <=4.00)

## 2015-09-12 ENCOUNTER — Encounter: Payer: Self-pay | Admitting: *Deleted

## 2015-09-30 DIAGNOSIS — J301 Allergic rhinitis due to pollen: Secondary | ICD-10-CM | POA: Diagnosis not present

## 2015-09-30 DIAGNOSIS — L509 Urticaria, unspecified: Secondary | ICD-10-CM | POA: Diagnosis not present

## 2015-09-30 DIAGNOSIS — J3089 Other allergic rhinitis: Secondary | ICD-10-CM | POA: Diagnosis not present

## 2015-09-30 DIAGNOSIS — J3081 Allergic rhinitis due to animal (cat) (dog) hair and dander: Secondary | ICD-10-CM | POA: Diagnosis not present

## 2015-10-21 DIAGNOSIS — J301 Allergic rhinitis due to pollen: Secondary | ICD-10-CM | POA: Diagnosis not present

## 2015-10-21 DIAGNOSIS — J3089 Other allergic rhinitis: Secondary | ICD-10-CM | POA: Diagnosis not present

## 2015-10-21 DIAGNOSIS — J3081 Allergic rhinitis due to animal (cat) (dog) hair and dander: Secondary | ICD-10-CM | POA: Diagnosis not present

## 2015-10-23 DIAGNOSIS — J3089 Other allergic rhinitis: Secondary | ICD-10-CM | POA: Diagnosis not present

## 2015-10-23 DIAGNOSIS — J3081 Allergic rhinitis due to animal (cat) (dog) hair and dander: Secondary | ICD-10-CM | POA: Diagnosis not present

## 2015-10-26 ENCOUNTER — Telehealth: Payer: Self-pay | Admitting: Gastroenterology

## 2015-10-26 NOTE — Telephone Encounter (Signed)
Received Records from [facility] and placed on Dr. Corena Pilgrim desk for review. Dr. Loletha Carrow is Doc of the Day for 09/10/15

## 2015-10-28 DIAGNOSIS — L509 Urticaria, unspecified: Secondary | ICD-10-CM | POA: Diagnosis not present

## 2015-10-28 DIAGNOSIS — L501 Idiopathic urticaria: Secondary | ICD-10-CM | POA: Diagnosis not present

## 2015-10-28 NOTE — Telephone Encounter (Signed)
Called Stephen Obrien to schedule his Direct Colonoscopy. Left a voicemail to call us back at his convenience.

## 2015-11-03 DIAGNOSIS — L501 Idiopathic urticaria: Secondary | ICD-10-CM | POA: Diagnosis not present

## 2015-11-10 DIAGNOSIS — J3081 Allergic rhinitis due to animal (cat) (dog) hair and dander: Secondary | ICD-10-CM | POA: Diagnosis not present

## 2015-11-10 DIAGNOSIS — J3089 Other allergic rhinitis: Secondary | ICD-10-CM | POA: Diagnosis not present

## 2015-11-10 DIAGNOSIS — J301 Allergic rhinitis due to pollen: Secondary | ICD-10-CM | POA: Diagnosis not present

## 2015-11-17 DIAGNOSIS — L509 Urticaria, unspecified: Secondary | ICD-10-CM | POA: Diagnosis not present

## 2015-11-17 DIAGNOSIS — J301 Allergic rhinitis due to pollen: Secondary | ICD-10-CM | POA: Diagnosis not present

## 2015-11-17 DIAGNOSIS — J3089 Other allergic rhinitis: Secondary | ICD-10-CM | POA: Diagnosis not present

## 2015-11-24 NOTE — Telephone Encounter (Signed)
Patient is requesting to see Dr. [name]. Dr. [name] did review records and has accepted patient. Ok to schedule Direct Colon. Left message for patient to return my call.

## 2015-11-25 DIAGNOSIS — J3089 Other allergic rhinitis: Secondary | ICD-10-CM | POA: Diagnosis not present

## 2015-11-25 DIAGNOSIS — J301 Allergic rhinitis due to pollen: Secondary | ICD-10-CM | POA: Diagnosis not present

## 2015-11-25 DIAGNOSIS — L509 Urticaria, unspecified: Secondary | ICD-10-CM | POA: Diagnosis not present

## 2015-11-25 DIAGNOSIS — J3081 Allergic rhinitis due to animal (cat) (dog) hair and dander: Secondary | ICD-10-CM | POA: Diagnosis not present

## 2015-11-27 DIAGNOSIS — J3081 Allergic rhinitis due to animal (cat) (dog) hair and dander: Secondary | ICD-10-CM | POA: Diagnosis not present

## 2015-11-27 DIAGNOSIS — J3089 Other allergic rhinitis: Secondary | ICD-10-CM | POA: Diagnosis not present

## 2015-11-27 DIAGNOSIS — J301 Allergic rhinitis due to pollen: Secondary | ICD-10-CM | POA: Diagnosis not present

## 2015-11-30 ENCOUNTER — Encounter: Payer: Self-pay | Admitting: Internal Medicine

## 2015-11-30 DIAGNOSIS — L501 Idiopathic urticaria: Secondary | ICD-10-CM | POA: Diagnosis not present

## 2015-12-17 ENCOUNTER — Telehealth: Payer: Self-pay

## 2015-12-17 NOTE — Telephone Encounter (Signed)
[facility] [phone]  zolpidem (AMBIEN) 10 MG tablet   Refill

## 2015-12-18 ENCOUNTER — Other Ambulatory Visit: Payer: Self-pay | Admitting: Emergency Medicine

## 2015-12-18 DIAGNOSIS — G4700 Insomnia, unspecified: Secondary | ICD-10-CM

## 2015-12-18 MED ORDER — ZOLPIDEM TARTRATE 10 MG PO TABS: ORAL_TABLET | ORAL | Status: DC

## 2015-12-18 NOTE — Telephone Encounter (Signed)
I have printed and will fax

## 2015-12-18 NOTE — Telephone Encounter (Signed)
Notified pt RF was faxed.

## 2015-12-23 DIAGNOSIS — J3089 Other allergic rhinitis: Secondary | ICD-10-CM | POA: Diagnosis not present

## 2015-12-23 DIAGNOSIS — L501 Idiopathic urticaria: Secondary | ICD-10-CM | POA: Diagnosis not present

## 2015-12-23 DIAGNOSIS — J3081 Allergic rhinitis due to animal (cat) (dog) hair and dander: Secondary | ICD-10-CM | POA: Diagnosis not present

## 2015-12-23 DIAGNOSIS — J301 Allergic rhinitis due to pollen: Secondary | ICD-10-CM | POA: Diagnosis not present

## 2015-12-23 DIAGNOSIS — L509 Urticaria, unspecified: Secondary | ICD-10-CM | POA: Diagnosis not present

## 2016-01-01 DIAGNOSIS — J3089 Other allergic rhinitis: Secondary | ICD-10-CM | POA: Diagnosis not present

## 2016-01-01 DIAGNOSIS — J301 Allergic rhinitis due to pollen: Secondary | ICD-10-CM | POA: Diagnosis not present

## 2016-01-01 DIAGNOSIS — L501 Idiopathic urticaria: Secondary | ICD-10-CM | POA: Diagnosis not present

## 2016-01-01 DIAGNOSIS — J3081 Allergic rhinitis due to animal (cat) (dog) hair and dander: Secondary | ICD-10-CM | POA: Diagnosis not present

## 2016-01-07 ENCOUNTER — Ambulatory Visit (AMBULATORY_SURGERY_CENTER): Payer: Self-pay

## 2016-01-07 ENCOUNTER — Encounter: Payer: Self-pay | Admitting: Internal Medicine

## 2016-01-07 VITALS — Ht 72.75 in | Wt 236.0 lb

## 2016-01-07 DIAGNOSIS — Z1211 Encounter for screening for malignant neoplasm of colon: Secondary | ICD-10-CM

## 2016-01-07 MED ORDER — NA SULFATE-K SULFATE-MG SULF 17.5-3.13-1.6 GM/177ML PO SOLN: 1.0000 | Freq: Once | ORAL | Status: DC

## 2016-01-07 NOTE — Progress Notes (Signed)
No egg or soy allergy.  No previous complications from anesthesia. No home O2. No diet meds. 

## 2016-01-21 ENCOUNTER — Ambulatory Visit (AMBULATORY_SURGERY_CENTER): Payer: BLUE CROSS/BLUE SHIELD | Admitting: Internal Medicine

## 2016-01-21 ENCOUNTER — Encounter: Payer: Self-pay | Admitting: Internal Medicine

## 2016-01-21 VITALS — BP 112/68 | HR 69 | Temp 98.2°F | Resp 13 | Ht 72.75 in | Wt 236.0 lb

## 2016-01-21 DIAGNOSIS — Z1211 Encounter for screening for malignant neoplasm of colon: Secondary | ICD-10-CM

## 2016-01-21 MED ORDER — SODIUM CHLORIDE 0.9 % IV SOLN: 500.0000 mL | INTRAVENOUS | Status: DC

## 2016-01-21 NOTE — Op Note (Signed)
[facility] Patient Name: Stephen Obrien Procedure Date: 01/21/2016 9:14 AM MRN: [id number] Endoscopist: Jerene Bears , MD Age: 50 Referring MD:  Date of Birth: January 19, 1966 Gender: Male Account #: [id number] Procedure:                Colonoscopy Indications:              Screening for colorectal malignant neoplasm, This                            is the patient's first colonoscopy Medicines:                Monitored Anesthesia Care Procedure:                Pre-Anesthesia Assessment:                           - Prior to the procedure, a History and Physical                            was performed, and patient medications and                            allergies were reviewed. The patient's tolerance of                            previous anesthesia was also reviewed. The risks                            and benefits of the procedure and the sedation                            options and risks were discussed with the patient.                            All questions were answered, and informed consent                            was obtained. Prior Anticoagulants: The patient has                            taken no previous anticoagulant or antiplatelet                            agents. ASA Grade Assessment: II - A patient with                            mild systemic disease. After reviewing the risks                            and benefits, the patient was deemed in                            satisfactory condition to undergo the procedure.  After obtaining informed consent, the colonoscope                            was passed under direct vision. Throughout the                            procedure, the patient's blood pressure, pulse, and                            oxygen saturations were monitored continuously. The                            Model CF-H180AL [id number]) scope was introduced                            through the anus and advanced  to the the cecum,                            identified by appendiceal orifice and ileocecal                            valve. The colonoscopy was performed without                            difficulty. The patient tolerated the procedure                            well. The quality of the bowel preparation was                            excellent. The ileocecal valve, appendiceal                            orifice, and rectum were photographed. Scope In: 9:31:40 AM Scope Out: 9:44:16 AM Scope Withdrawal Time: 0 hours 10 minutes 44 seconds  Total Procedure Duration: 0 hours 12 minutes 36 seconds  Findings:                 The digital rectal exam was normal.                           A few small-mouthed diverticula were found in the                            sigmoid colon.                           Internal hemorrhoids were found during                            retroflexion. The hemorrhoids were small.                           The exam was otherwise without abnormality. Complications:            No immediate complications. Estimated  Blood Loss:     Estimated blood loss: none. Impression:               - Diverticulosis in the sigmoid colon.                           - Small internal hemorrhoids.                           - The examination was otherwise normal.                           - No specimens collected. Recommendation:           - Patient has a contact number available for                            emergencies. The signs and symptoms of potential                            delayed complications were discussed with the                            patient. Return to normal activities tomorrow.                            Written discharge instructions were provided to the                            patient.                           - Resume previous diet.                           - Continue present medications.                           - Repeat colonoscopy in 10 years for  screening                            purposes. Jerene Bears, MD 01/21/2016 9:50:50 AM This report has been signed electronically.

## 2016-01-21 NOTE — Progress Notes (Signed)
To recovery, report to Scott, RN, VSS 

## 2016-01-21 NOTE — Patient Instructions (Signed)
YOU HAD AN ENDOSCOPIC PROCEDURE TODAY AT [facility]:   Refer to the procedure report that was given to you for any specific questions about what was found during the examination.  If the procedure report does not answer your questions, please call your gastroenterologist to clarify.  If you requested that your care partner not be given the details of your procedure findings, then the procedure report has been included in a sealed envelope for you to review at your convenience later.  YOU SHOULD EXPECT: Some feelings of bloating in the abdomen. Passage of more gas than usual.  Walking can help get rid of the air that was put into your GI tract during the procedure and reduce the bloating. If you had a lower endoscopy (such as a colonoscopy or flexible sigmoidoscopy) you may notice spotting of blood in your stool or on the toilet paper. If you underwent a bowel prep for your procedure, you may not have a normal bowel movement for a few days.  Please Note:  You might notice some irritation and congestion in your nose or some drainage.  This is from the oxygen used during your procedure.  There is no need for concern and it should clear up in a day or so.  SYMPTOMS TO REPORT IMMEDIATELY:   Following lower endoscopy (colonoscopy or flexible sigmoidoscopy):  Excessive amounts of blood in the stool  Significant tenderness or worsening of abdominal pains  Swelling of the abdomen that is new, acute  Fever of 100F or higher   For urgent or emergent issues, a gastroenterologist can be reached at any hour by calling [phone].   DIET: Your first meal following the procedure should be a small meal and then it is ok to progress to your normal diet. Heavy or fried foods are harder to digest and may make you feel nauseous or bloated.  Likewise, meals heavy in dairy and vegetables can increase bloating.  Drink plenty of fluids but you should avoid alcoholic beverages for 24  hours.  ACTIVITY:  You should plan to take it easy for the rest of today and you should NOT DRIVE or use heavy machinery until tomorrow (because of the sedation medicines used during the test).    FOLLOW UP: Our staff will call the number listed on your records the next business day following your procedure to check on you and address any questions or concerns that you may have regarding the information given to you following your procedure. If we do not reach you, we will leave a message.  However, if you are feeling well and you are not experiencing any problems, there is no need to return our call.  We will assume that you have returned to your regular daily activities without incident.  If any biopsies were taken you will be contacted by phone or by letter within the next 1-3 weeks.  Please call us at [phone] if you have not heard about the biopsies in 3 weeks.    SIGNATURES/CONFIDENTIALITY: You and/or your care partner have signed paperwork which will be entered into your electronic medical record.  These signatures attest to the fact that that the information above on your After Visit Summary has been reviewed and is understood.  Full responsibility of the confidentiality of this discharge information lies with you and/or your care-partner.  Diverticulosis, and high fiber diet information given.  Hemorrhoid information given.  Recall 10 years-2027

## 2016-01-22 DIAGNOSIS — L509 Urticaria, unspecified: Secondary | ICD-10-CM | POA: Diagnosis not present

## 2016-02-01 DIAGNOSIS — L509 Urticaria, unspecified: Secondary | ICD-10-CM | POA: Diagnosis not present

## 2016-02-19 DIAGNOSIS — L509 Urticaria, unspecified: Secondary | ICD-10-CM | POA: Diagnosis not present

## 2016-02-19 DIAGNOSIS — L501 Idiopathic urticaria: Secondary | ICD-10-CM | POA: Diagnosis not present

## 2016-03-02 DIAGNOSIS — L501 Idiopathic urticaria: Secondary | ICD-10-CM | POA: Diagnosis not present

## 2016-03-25 DIAGNOSIS — L509 Urticaria, unspecified: Secondary | ICD-10-CM | POA: Diagnosis not present

## 2016-04-04 DIAGNOSIS — L501 Idiopathic urticaria: Secondary | ICD-10-CM | POA: Diagnosis not present

## 2016-04-26 DIAGNOSIS — L509 Urticaria, unspecified: Secondary | ICD-10-CM | POA: Diagnosis not present

## 2016-05-03 DIAGNOSIS — Z23 Encounter for immunization: Secondary | ICD-10-CM | POA: Diagnosis not present

## 2016-05-03 DIAGNOSIS — L501 Idiopathic urticaria: Secondary | ICD-10-CM | POA: Diagnosis not present

## 2016-05-26 DIAGNOSIS — L509 Urticaria, unspecified: Secondary | ICD-10-CM | POA: Diagnosis not present

## 2016-06-01 DIAGNOSIS — L501 Idiopathic urticaria: Secondary | ICD-10-CM | POA: Diagnosis not present

## 2016-06-23 DIAGNOSIS — L509 Urticaria, unspecified: Secondary | ICD-10-CM | POA: Diagnosis not present

## 2016-06-29 DIAGNOSIS — L501 Idiopathic urticaria: Secondary | ICD-10-CM | POA: Diagnosis not present

## 2016-07-21 DIAGNOSIS — L509 Urticaria, unspecified: Secondary | ICD-10-CM | POA: Diagnosis not present

## 2016-07-27 DIAGNOSIS — L501 Idiopathic urticaria: Secondary | ICD-10-CM | POA: Diagnosis not present

## 2016-08-19 DIAGNOSIS — L509 Urticaria, unspecified: Secondary | ICD-10-CM | POA: Diagnosis not present

## 2016-08-24 DIAGNOSIS — L501 Idiopathic urticaria: Secondary | ICD-10-CM | POA: Diagnosis not present

## 2016-09-17 ENCOUNTER — Other Ambulatory Visit: Payer: Self-pay

## 2016-09-17 MED ORDER — ATORVASTATIN CALCIUM 40 MG PO TABS: 40.0000 mg | ORAL_TABLET | Freq: Every day | ORAL | 0 refills | Status: DC

## 2016-09-19 DIAGNOSIS — L509 Urticaria, unspecified: Secondary | ICD-10-CM | POA: Diagnosis not present

## 2016-09-23 DIAGNOSIS — L501 Idiopathic urticaria: Secondary | ICD-10-CM | POA: Diagnosis not present

## 2016-10-05 DIAGNOSIS — A493 Mycoplasma infection, unspecified site: Secondary | ICD-10-CM | POA: Diagnosis not present

## 2016-10-18 DIAGNOSIS — L509 Urticaria, unspecified: Secondary | ICD-10-CM | POA: Diagnosis not present

## 2016-10-21 DIAGNOSIS — L501 Idiopathic urticaria: Secondary | ICD-10-CM | POA: Diagnosis not present

## 2016-11-03 ENCOUNTER — Encounter: Payer: Self-pay | Admitting: Family Medicine

## 2016-11-03 ENCOUNTER — Ambulatory Visit (INDEPENDENT_AMBULATORY_CARE_PROVIDER_SITE_OTHER): Payer: BLUE CROSS/BLUE SHIELD | Admitting: Family Medicine

## 2016-11-03 VITALS — BP 125/85 | HR 84 | Temp 98.1°F | Resp 16 | Ht 72.75 in | Wt 244.0 lb

## 2016-11-03 DIAGNOSIS — E785 Hyperlipidemia, unspecified: Secondary | ICD-10-CM | POA: Diagnosis not present

## 2016-11-03 DIAGNOSIS — Z0000 Encounter for general adult medical examination without abnormal findings: Secondary | ICD-10-CM

## 2016-11-03 DIAGNOSIS — Z6832 Body mass index (BMI) 32.0-32.9, adult: Secondary | ICD-10-CM

## 2016-11-03 DIAGNOSIS — L209 Atopic dermatitis, unspecified: Secondary | ICD-10-CM | POA: Diagnosis not present

## 2016-11-03 DIAGNOSIS — G4700 Insomnia, unspecified: Secondary | ICD-10-CM

## 2016-11-03 DIAGNOSIS — Z125 Encounter for screening for malignant neoplasm of prostate: Secondary | ICD-10-CM | POA: Diagnosis not present

## 2016-11-03 DIAGNOSIS — J309 Allergic rhinitis, unspecified: Secondary | ICD-10-CM

## 2016-11-03 DIAGNOSIS — E669 Obesity, unspecified: Secondary | ICD-10-CM

## 2016-11-03 MED ORDER — ATORVASTATIN CALCIUM 40 MG PO TABS: 40.0000 mg | ORAL_TABLET | Freq: Every day | ORAL | 3 refills | Status: DC

## 2016-11-03 NOTE — Patient Instructions (Addendum)
Increase exercise for weight loss. Continue to watch diet. Follow-up with me in 6 months to look at weight and cholesterol that time. Try half dose of Ambien to see if that is just as effective. Let me know if refills are needed. Let me know if you have questions in the meantime.  Keeping you healthy  Get these tests  Blood pressure- Have your blood pressure checked once a year by your healthcare provider.  Normal blood pressure is 120/80  Weight- Have your body mass index (BMI) calculated to screen for obesity.  BMI is a measure of body fat based on height and weight. You can also calculate your own BMI at ViewBanking.si.  Cholesterol- Have your cholesterol checked every year.  Diabetes- Have your blood sugar checked regularly if you have high blood pressure, high cholesterol, have a family history of diabetes or if you are overweight.  Screening for Colon Cancer- Colonoscopy starting at age 30.  Screening may begin sooner depending on your family history and other health conditions. Follow up colonoscopy as directed by your Gastroenterologist.  Screening for Prostate Cancer- Both blood work (PSA) and a rectal exam help screen for Prostate Cancer.  Screening begins at age 76 with African-American men and at age 42 with Caucasian men.  Screening may begin sooner depending on your family history.  Take these medicines  Aspirin- One aspirin daily can help prevent Heart disease and Stroke.  Flu shot- Every fall.  Tetanus- Every 10 years.  Zostavax- Once after the age of 69 to prevent Shingles.  Pneumonia shot- Once after the age of 56; if you are younger than 61, ask your healthcare provider if you need a Pneumonia shot.  Take these steps  Don't smoke- If you do smoke, talk to your doctor about quitting.  For tips on how to quit, go to www.smokefree.gov or call 1-800-QUIT-NOW.  Be physically active- Exercise 5 days a week for at least 30 minutes.  If you are not already  physically active start slow and gradually work up to 30 minutes of moderate physical activity.  Examples of moderate activity include walking briskly, mowing the yard, dancing, swimming, bicycling, etc.  Eat a healthy diet- Eat a variety of healthy food such as fruits, vegetables, low fat milk, low fat cheese, yogurt, lean meant, poultry, fish, beans, tofu, etc. For more information go to www.thenutritionsource.org  Drink alcohol in moderation- Limit alcohol intake to less than two drinks a day. Never drink and drive.  Dentist- Brush and floss twice daily; visit your dentist twice a year.  Depression- Your emotional health is as important as your physical health. If you're feeling down, or losing interest in things you would normally enjoy please talk to your healthcare provider.  Eye exam- Visit your eye doctor every year.  Safe sex- If you may be exposed to a sexually transmitted infection, use a condom.  Seat belts- Seat belts can save your life; always wear one.  Smoke/Carbon Monoxide detectors- These detectors need to be installed on the appropriate level of your home.  Replace batteries at least once a year.  Skin cancer- When out in the sun, cover up and use sunscreen 15 SPF or higher.  Violence- If anyone is threatening you, please tell your healthcare provider.  Living Will/ Health care power of attorney- Speak with your healthcare provider and family.   IF you received an x-ray today, you will receive an invoice from [facility]. Please contact [facility] at [phone] with questions or  concerns regarding your invoice.   IF you received labwork today, you will receive an invoice from [facility]. Please contact [facility] at [phone] with questions or concerns regarding your invoice.   Our billing staff will not be able to assist you with questions regarding bills from these companies.  You will be contacted with the lab results as soon as they are  available. The fastest way to get your results is to activate your My Chart account. Instructions are located on the last page of this paperwork. If you have not heard from [place] regarding the results in 2 weeks, please contact this office.

## 2016-11-03 NOTE — Progress Notes (Signed)
By signing my name below, I, Stephen Obrien, attest that this documentation has been prepared under the direction and in the presence of Stephen Ray, MD.  Electronically Signed: Verlee Obrien, Medical Scribe. 11/03/16. 10:49 AM.  Subjective:    Patient ID: Stephen Obrien, male    DOB: [birth year], 51 y.o.   MRN: [id number]  HPI Chief Complaint  Patient presents with  . Annual Exam    HPI Comments: Stephen Obrien is a 51 y.o. male who presents to the Primary Care at [facility] for his annual exam. He is a new pt to me. Previously followed by Dr. Everlene Obrien; last physical with Dr. Everlene Obrien Feb 2017. He has a hx of HLD, allergies, and insomnia. He was referred to colonoscopy and cardiac evaluation at last visit.  ROS complaints: Dental Problems/Mouth Sores: Pt is followed by a dentist, last seen 5 days ago. Pt has tori and has been referred to Dr. Holley Dexter to resect his gums. He also has a ulcer that hasn't been healing well. Seasonal Allergies: Takes xyzal 5 mg QD. reports associated sxs of rhinorrhea, sinus pressure, and sneezing. Pt is compliant with xyzal, and xolair injections (every 28 days) with relief of his sxs. Pt's allergist is Dr. Donneta Obrien.  HLD: Takes lipitor 40 mg QD. He was advised to exercise with weight loss last visit. Pt is compliant with lipitor without experiencing any negative side effects from his medication. Lab Results  Component Value Date   CHOL 149 09/10/2015   HDL 33 (L) 09/10/2015   LDLCALC 95 09/10/2015   TRIG 107 09/10/2015   CHOLHDL 4.5 09/10/2015   Lab Results  Component Value Date   ALT 36 09/10/2015   AST 20 09/10/2015   ALKPHOS 76 09/10/2015   BILITOT 0.7 09/10/2015   Wt Readings from Last 3 Encounters:  11/03/16 244 lb (110.7 kg)  01/21/16 236 lb (107 kg)  01/07/16 236 lb (107 kg)   Insomnia: Took ambien in the past #90 in one refill in June 2017. Takes ambien 1-2x a week for difficulty sleeping. Pt doesn't sleep well on  the weekend and normally takes [place] then.  Cancer Screening: Colon: Colonoscopy with Dr. Hilarie Obrien 01/2016. Diverticulosis and internal hemorrhoids, otherwise nl. Repeat in 10 years. Prostate: Agrees to DRE and blood work for CA screening. Lab Results  Component Value Date   PSA 0.63 09/10/2015   PSA 0.69 09/09/2014   PSA 0.81 09/06/2013  Skin: Uses triamcinolone cream for his eczema, which typically flares in the winter. Followed by Dr. Harriett Obrien at [facility].  HIV Screening: Completed Feb 2017  Immunizations: Immunization History  Administered Date(s) Administered  . Influenza, Seasonal, Injecte, Preservative Fre 06/12/2012  . Influenza,inj,Quad PF,36+ Mos 09/06/2013, 09/09/2014, 09/10/2015  . Tdap 04/24/2007   Vision: Pt was last seen 2 years ago. Pt wears glasses to drive at night.  Visual Acuity Screening   Right eye Left eye Both eyes  Without correction: 20/20 20/20 20/20   With correction:      Dentist: Pt is followed by a dentist, last seen 5 days ago. Pt has tori and has been referred to Dr. Holley Dexter to resect his gums. He also has a ulcer that hasn't been healing well.  Exercise: He does yoga for exercise. Weight has increased some as above. Exercises 2-3x a week. Pt eats salad for lunch most of the time, stopped drinking soda 4-5 years ago, and he rarely eats out. Pt will spilt a bottle of wine with  his wife a couple of times a week. Body mass index is 32.41 kg/m.  Depression Screening: Depression screen [facility] 2/9 11/03/2016 09/10/2015 08/20/2015 09/09/2014 09/06/2013  Decreased Interest 0 0 0 0 0  Down, Depressed, Hopeless 0 0 0 0 0  PHQ - 2 Score 0 0 0 0 0   Patient Active Problem List   Diagnosis Date Noted  . Insomnia 09/10/2015  . Allergic urticaria 09/06/2013  . Other and unspecified hyperlipidemia 09/06/2013   Past Medical History:  Diagnosis Date  . Allergy   . Anxiety   . Arthritis   . Hyperlipidemia    Past Surgical History:    Procedure Laterality Date  . [place]  . VASECTOMY    . WISDOM TOOTH EXTRACTION  1983   No Known Allergies Prior to Admission medications   Medication Sig Start Date End Date Taking? Authorizing Provider  atorvastatin (LIPITOR) 40 MG tablet Take 1 tablet (40 mg total) by mouth daily. 09/17/16   Tereasa Coop, PA-C  levocetirizine (XYZAL) 5 MG tablet Take 1 tablet (5 mg total) by mouth every evening. 09/10/15   [name], MD  Omalizumab [name] [place]) Inject into the skin.    Historical Provider, MD  SF 1.1 % GEL dental gel USE IN MOUTH TRAYS AFTER BRUSHING WITH TOOTHPASTE 11/16/15   Historical Provider, MD  zolpidem (AMBIEN) 10 MG tablet TAKE 1 TABLET BY MOUTH EVERY DAY AT BEDTIME AS NEEDED 12/18/15   [name], MD   Social History   Social History  . Marital status: Married    Spouse name: N/A  . Number of children: N/A  . Years of education: N/A   Occupational History  . Not on file.   Social History Main Topics  . Smoking status: Never Smoker  . Smokeless tobacco: Never Used  . Alcohol use 2.4 oz/week    4 Standard drinks or equivalent per week     Comment: wine  . Drug use: No  . Sexual activity: Yes   Other Topics Concern  . Not on file   Social History Narrative  . No narrative on file   Review of Systems  HENT: Positive for dental problem, mouth sores, rhinorrhea, sinus pressure and sneezing.   Allergic/Immunologic: Positive for environmental allergies.  13 point ROS positive for the above.  Objective:  Physical Exam  Constitutional: He is oriented to person, place, and time. He appears well-developed and well-nourished.  HENT:  Head: Normocephalic and atraumatic.  Right Ear: External ear normal.  Left Ear: External ear normal.  Mouth/Throat: Oropharynx is clear and moist.  Gum and bony prominence of the lower and upper gum line  Eyes: Conjunctivae and EOM are normal. Pupils are equal, round, and reactive to light.  Neck: Normal range of  motion. Neck supple. No thyromegaly present.  Cardiovascular: Normal rate, regular rhythm, normal heart sounds and intact distal pulses.   Pulmonary/Chest: Effort normal and breath sounds normal. No respiratory distress. He has no wheezes.  Abdominal: Soft. He exhibits no distension. There is no tenderness. Hernia confirmed negative in the right inguinal area and confirmed negative in the left inguinal area.  Genitourinary: Prostate normal.  Musculoskeletal: Normal range of motion. He exhibits no edema or tenderness.  Lymphadenopathy:    He has no cervical adenopathy.  Neurological: He is alert and oriented to person, place, and time. He has normal reflexes.  Skin: Skin is warm and dry.  Psychiatric: He has a normal mood and  affect. His behavior is normal.  Vitals reviewed.   Vitals:   11/03/16 1000  BP: 125/85  Pulse: 84  Resp: 16  Temp: 98.1 F (36.7 C)  TempSrc: Oral  SpO2: 97%  Weight: 244 lb (110.7 kg)  Height: 6' 0.75" (1.848 m)  Body mass index is 32.41 kg/m.  Assessment & Plan:   Kimberley Dastrup is a 51 y.o. male Annual physical exam  --anticipatory guidance as below in AVS, screening labs above. Health maintenance items as above in HPI discussed/recommended as applicable.    Hyperlipidemia, unspecified hyperlipidemia type - Plan: Comprehensive metabolic panel, Lipid panel  - labs pending. Tolerating Lipitor, no med changes, refilled for 1 year.  Atopic dermatitis, unspecified type  -Has topical steroid to use as needed for treatment. Continue routine follow-up with dermatology.  Insomnia, unspecified type  - Stable with rare use of Ambien. No med changes. No refills needed at this time. Did recommend trying half dose or 5 mg daily at bedtime when necessary to see if just as effective  Allergic rhinitis, unspecified seasonality, unspecified trigger  -Continue Xyzal, follow-up with allergist.  Screening for prostate cancer - Plan: PSA  -We discussed pros and cons of  prostate cancer screening, and after this discussion, he chose to have screening done. PSA obtained, and no concerning findings on DRE.   Class 1 obesity without serious comorbidity with body mass index (BMI) of 32.0 to 32.9 in adult, unspecified obesity type  - Watching diet, increase exercise discussed. Recheck in 6 months to evaluate weight as well as repeat cholesterol testing at that time.  Meds ordered this encounter  Medications  . atorvastatin (LIPITOR) 40 MG tablet    Sig: Take 1 tablet (40 mg total) by mouth daily at 6 PM.    Dispense:  90 tablet    Refill:  3   Patient Instructions    Increase exercise for weight loss. Continue to watch diet. Follow-up with me in 6 months to look at weight and cholesterol that time. Try half dose of Ambien to see if that is just as effective. Let me know if refills are needed. Let me know if you have questions in the meantime.  Keeping you healthy  Get these tests  Blood pressure- Have your blood pressure checked once a year by your healthcare provider.  Normal blood pressure is 120/80  Weight- Have your body mass index (BMI) calculated to screen for obesity.  BMI is a measure of body fat based on height and weight. You can also calculate your own BMI at [email or web address].  Cholesterol- Have your cholesterol checked every year.  Diabetes- Have your blood sugar checked regularly if you have high blood pressure, high cholesterol, have a family history of diabetes or if you are overweight.  Screening for Colon Cancer- Colonoscopy starting at age 95.  Screening may begin sooner depending on your family history and other health conditions. Follow up colonoscopy as directed by your Gastroenterologist.  Screening for Prostate Cancer- Both blood work (PSA) and a rectal exam help screen for Prostate Cancer.  Screening begins at age 65 with African-American men and at age 23 with Caucasian men.  Screening may begin sooner depending on your  family history.  Take these medicines  Aspirin- One aspirin daily can help prevent Heart disease and Stroke.  Flu shot- Every fall.  Tetanus- Every 10 years.  Zostavax- Once after the age of 65 to prevent Shingles.  Pneumonia shot- Once after the age of 82; if you  are younger than 29, ask your healthcare provider if you need a Pneumonia shot.  Take these steps  Don't smoke- If you do smoke, talk to your doctor about quitting.  For tips on how to quit, go to www.smokefree.gov or call 1-800-QUIT-NOW.  Be physically active- Exercise 5 days a week for at least 30 minutes.  If you are not already physically active start slow and gradually work up to 30 minutes of moderate physical activity.  Examples of moderate activity include walking briskly, mowing the yard, dancing, swimming, bicycling, etc.  Eat a healthy diet- Eat a variety of healthy food such as fruits, vegetables, low fat milk, low fat cheese, yogurt, lean meant, poultry, fish, beans, tofu, etc. For more information go to www.thenutritionsource.org  Drink alcohol in moderation- Limit alcohol intake to less than two drinks a day. Never drink and drive.  Dentist- Brush and floss twice daily; visit your dentist twice a year.  Depression- Your emotional health is as important as your physical health. If you're feeling down, or losing interest in things you would normally enjoy please talk to your healthcare provider.  Eye exam- Visit your eye doctor every year.  Safe sex- If you may be exposed to a sexually transmitted infection, use a condom.  Seat belts- Seat belts can save your life; always wear one.  Smoke/Carbon Monoxide detectors- These detectors need to be installed on the appropriate level of your home.  Replace batteries at least once a year.  Skin cancer- When out in the sun, cover up and use sunscreen 15 SPF or higher.  Violence- If anyone is threatening you, please tell your healthcare provider.  Living Will/  Health care power of attorney- Speak with your healthcare provider and family.   IF you received an x-Obrien today, you will receive an invoice from [facility]. Please contact [facility] Radiology at [phone] with questions or concerns regarding your invoice.   IF you received labwork today, you will receive an invoice from [place]. Please contact [facility] at [phone] with questions or concerns regarding your invoice.   Our billing staff will not be able to assist you with questions regarding bills from these companies.  You will be contacted with the lab results as soon as they are available. The fastest way to get your results is to activate your My Chart account. Instructions are located on the last page of this paperwork. If you have not heard from [place] regarding the results in 2 weeks, please contact this office.       I personally performed the services described in this documentation, which was scribed in my presence. The recorded information has been reviewed and considered for accuracy and completeness, addended by me as needed, and agree with information above.  Signed,   Stephen Ray, MD Primary Care at [place].  11/04/16 10:58 PM

## 2016-11-04 LAB — COMPREHENSIVE METABOLIC PANEL
ALT: 41 IU/L (ref 0–44)
AST: 24 IU/L (ref 0–40)
Albumin/Globulin Ratio: 1.8 (ref 1.2–2.2)
Albumin: 4.6 g/dL (ref 3.5–5.5)
Alkaline Phosphatase: 90 IU/L (ref 39–117)
BILIRUBIN TOTAL: 0.5 mg/dL (ref 0.0–1.2)
BUN/Creatinine Ratio: 11 (ref 9–20)
BUN: 12 mg/dL (ref 6–24)
CO2: 20 mmol/L (ref 18–29)
CREATININE: 1.07 mg/dL (ref 0.76–1.27)
Calcium: 9.8 mg/dL (ref 8.7–10.2)
Chloride: 105 mmol/L (ref 96–106)
GFR calc Af Amer: 92 mL/min/{1.73_m2} (ref >59)
GFR calc non Af Amer: 80 mL/min/{1.73_m2} (ref >59)
GLUCOSE: 90 mg/dL (ref 65–99)
Globulin, Total: 2.6 g/dL (ref 1.5–4.5)
Potassium: 4.2 mmol/L (ref 3.5–5.2)
Sodium: 142 mmol/L (ref 134–144)
Total Protein: 7.2 g/dL (ref 6.0–8.5)

## 2016-11-04 LAB — LIPID PANEL
Chol/HDL Ratio: 4.4 ratio (ref 0.0–5.0)
Cholesterol, Total: 149 mg/dL (ref 100–199)
HDL: 34 mg/dL — ABNORMAL LOW (ref >39)
LDL CALC: 90 mg/dL (ref 0–99)
TRIGLYCERIDES: 123 mg/dL (ref 0–149)
VLDL Cholesterol Cal: 25 mg/dL (ref 5–40)

## 2016-11-04 LAB — PSA: PROSTATE SPECIFIC AG, SERUM: 0.8 ng/mL (ref 0.0–4.0)

## 2016-11-16 DIAGNOSIS — L509 Urticaria, unspecified: Secondary | ICD-10-CM | POA: Diagnosis not present

## 2016-11-18 DIAGNOSIS — L501 Idiopathic urticaria: Secondary | ICD-10-CM | POA: Diagnosis not present

## 2016-12-02 DIAGNOSIS — L509 Urticaria, unspecified: Secondary | ICD-10-CM | POA: Diagnosis not present

## 2016-12-02 DIAGNOSIS — J3089 Other allergic rhinitis: Secondary | ICD-10-CM | POA: Diagnosis not present

## 2016-12-02 DIAGNOSIS — J301 Allergic rhinitis due to pollen: Secondary | ICD-10-CM | POA: Diagnosis not present

## 2016-12-15 DIAGNOSIS — L509 Urticaria, unspecified: Secondary | ICD-10-CM | POA: Diagnosis not present

## 2016-12-19 DIAGNOSIS — L501 Idiopathic urticaria: Secondary | ICD-10-CM | POA: Diagnosis not present

## 2017-01-16 DIAGNOSIS — L509 Urticaria, unspecified: Secondary | ICD-10-CM | POA: Diagnosis not present

## 2017-01-19 DIAGNOSIS — L501 Idiopathic urticaria: Secondary | ICD-10-CM | POA: Diagnosis not present

## 2017-02-14 DIAGNOSIS — L509 Urticaria, unspecified: Secondary | ICD-10-CM | POA: Diagnosis not present

## 2017-02-16 DIAGNOSIS — L501 Idiopathic urticaria: Secondary | ICD-10-CM | POA: Diagnosis not present

## 2017-03-15 DIAGNOSIS — L509 Urticaria, unspecified: Secondary | ICD-10-CM | POA: Diagnosis not present

## 2017-03-17 DIAGNOSIS — L509 Urticaria, unspecified: Secondary | ICD-10-CM | POA: Diagnosis not present

## 2017-04-19 DIAGNOSIS — L509 Urticaria, unspecified: Secondary | ICD-10-CM | POA: Diagnosis not present

## 2017-04-20 DIAGNOSIS — L501 Idiopathic urticaria: Secondary | ICD-10-CM | POA: Diagnosis not present

## 2017-05-15 DIAGNOSIS — H9313 Tinnitus, bilateral: Secondary | ICD-10-CM | POA: Diagnosis not present

## 2017-05-15 DIAGNOSIS — H6122 Impacted cerumen, left ear: Secondary | ICD-10-CM | POA: Diagnosis not present

## 2017-05-15 DIAGNOSIS — H9012 Conductive hearing loss, unilateral, left ear, with unrestricted hearing on the contralateral side: Secondary | ICD-10-CM | POA: Insufficient documentation

## 2017-05-18 DIAGNOSIS — L509 Urticaria, unspecified: Secondary | ICD-10-CM | POA: Diagnosis not present

## 2017-05-22 DIAGNOSIS — L501 Idiopathic urticaria: Secondary | ICD-10-CM | POA: Diagnosis not present

## 2017-06-13 ENCOUNTER — Telehealth: Payer: Self-pay | Admitting: Emergency Medicine

## 2017-06-13 DIAGNOSIS — G4700 Insomnia, unspecified: Secondary | ICD-10-CM

## 2017-06-13 NOTE — Telephone Encounter (Signed)
Request for controlled substance; previously seen by Dr Arlyss Queen

## 2017-06-13 NOTE — Telephone Encounter (Signed)
Copied from [place]. Topic: General - Other >> Jun 13, 2017 11:34 AM Darl Householder, RMA wrote: Reason for CRM: Rima from [employer] calling for medication refill for Zolpidem 10 mg to be sent to [facility]

## 2017-06-14 MED ORDER — ZOLPIDEM TARTRATE 10 MG PO TABS: 5.0000 mg | ORAL_TABLET | Freq: Every evening | ORAL | 1 refills | Status: DC | PRN

## 2017-06-14 NOTE — Telephone Encounter (Signed)
Discussed at last OV with plan for intermittent use and 5mg  dose if possible. Refilled.

## 2017-06-17 DIAGNOSIS — L509 Urticaria, unspecified: Secondary | ICD-10-CM | POA: Diagnosis not present

## 2017-06-19 NOTE — Telephone Encounter (Signed)
Patient informed prescription faxed in

## 2017-06-19 NOTE — Telephone Encounter (Signed)
Prescription faxed

## 2017-06-22 DIAGNOSIS — L501 Idiopathic urticaria: Secondary | ICD-10-CM | POA: Diagnosis not present

## 2017-07-19 DIAGNOSIS — L509 Urticaria, unspecified: Secondary | ICD-10-CM | POA: Diagnosis not present

## 2017-07-21 DIAGNOSIS — L501 Idiopathic urticaria: Secondary | ICD-10-CM | POA: Diagnosis not present

## 2017-08-17 DIAGNOSIS — L509 Urticaria, unspecified: Secondary | ICD-10-CM | POA: Diagnosis not present

## 2017-08-18 DIAGNOSIS — L501 Idiopathic urticaria: Secondary | ICD-10-CM | POA: Diagnosis not present

## 2017-09-15 DIAGNOSIS — L501 Idiopathic urticaria: Secondary | ICD-10-CM | POA: Diagnosis not present

## 2017-09-22 DIAGNOSIS — L570 Actinic keratosis: Secondary | ICD-10-CM | POA: Diagnosis not present

## 2017-09-22 DIAGNOSIS — L821 Other seborrheic keratosis: Secondary | ICD-10-CM | POA: Diagnosis not present

## 2017-10-13 DIAGNOSIS — L501 Idiopathic urticaria: Secondary | ICD-10-CM | POA: Diagnosis not present

## 2017-11-06 ENCOUNTER — Ambulatory Visit (INDEPENDENT_AMBULATORY_CARE_PROVIDER_SITE_OTHER): Payer: BLUE CROSS/BLUE SHIELD | Admitting: Family Medicine

## 2017-11-06 ENCOUNTER — Encounter: Payer: Self-pay | Admitting: Family Medicine

## 2017-11-06 ENCOUNTER — Other Ambulatory Visit: Payer: Self-pay

## 2017-11-06 VITALS — BP 122/76 | HR 80 | Temp 97.8°F | Ht 72.0 in | Wt 242.8 lb

## 2017-11-06 DIAGNOSIS — Z125 Encounter for screening for malignant neoplasm of prostate: Secondary | ICD-10-CM

## 2017-11-06 DIAGNOSIS — Z23 Encounter for immunization: Secondary | ICD-10-CM | POA: Diagnosis not present

## 2017-11-06 DIAGNOSIS — G4700 Insomnia, unspecified: Secondary | ICD-10-CM | POA: Diagnosis not present

## 2017-11-06 DIAGNOSIS — Z136 Encounter for screening for cardiovascular disorders: Secondary | ICD-10-CM | POA: Diagnosis not present

## 2017-11-06 DIAGNOSIS — E785 Hyperlipidemia, unspecified: Secondary | ICD-10-CM

## 2017-11-06 DIAGNOSIS — Z0000 Encounter for general adult medical examination without abnormal findings: Secondary | ICD-10-CM

## 2017-11-06 MED ORDER — ZOSTER VAC RECOMB ADJUVANTED 50 MCG/0.5ML IM SUSR: 0.5000 mL | Freq: Once | INTRAMUSCULAR | 1 refills | Status: AC

## 2017-11-06 MED ORDER — ATORVASTATIN CALCIUM 40 MG PO TABS: 40.0000 mg | ORAL_TABLET | Freq: Every day | ORAL | 3 refills | Status: DC

## 2017-11-06 NOTE — Patient Instructions (Addendum)
Thank you for coming in today.  I do not see any concerns in your exam.  I will refer you to the sleep specialist but okay to continue Ambien for now.  No change in cholesterol medication for now.  Lab work should be available within the next 2 weeks on my chart.  Let me know if you have questions in the meantime.  Keeping you healthy  Get these tests  Blood pressure- Have your blood pressure checked once a year by your healthcare provider.  Normal blood pressure is 120/80  Weight- Have your body mass index (BMI) calculated to screen for obesity.  BMI is a measure of body fat based on height and weight. You can also calculate your own BMI at ViewBanking.[email or web address].  Cholesterol- Have your cholesterol checked every year.  Diabetes- Have your blood sugar checked regularly if you have high blood pressure, high cholesterol, have a family history of diabetes or if you are overweight.  Screening for Colon Cancer- Colonoscopy starting at age 13.  Screening may begin sooner depending on your family history and other health conditions. Follow up colonoscopy as directed by your Gastroenterologist.  Screening for Prostate Cancer- Both blood work (PSA) and a rectal exam help screen for Prostate Cancer.  Screening begins at age 53 with African-American men and at age 32 with Caucasian men.  Screening may begin sooner depending on your family history.  Take these medicines  Aspirin- One aspirin daily can help prevent Heart disease and Stroke.  Flu shot- Every fall.  Tetanus- Every 10 years.  Zostavax- Once after the age of 17 to prevent Shingles.  Pneumonia shot- Once after the age of 1; if you are younger than 22, ask your healthcare provider if you need a Pneumonia shot.  Take these steps  Don't smoke- If you do smoke, talk to your doctor about quitting.  For tips on how to quit, go to www.smokefree.gov or call 1-800-QUIT-NOW.  Be physically active- Exercise 5 days a week for at least 30  minutes.  If you are not already physically active start slow and gradually work up to 30 minutes of moderate physical activity.  Examples of moderate activity include walking briskly, mowing the yard, dancing, swimming, bicycling, etc.  Eat a healthy diet- Eat a variety of healthy food such as fruits, vegetables, low fat milk, low fat cheese, yogurt, lean meant, poultry, fish, beans, tofu, etc. For more information go to www.thenutritionsource.org  Drink alcohol in moderation- Limit alcohol intake to less than two drinks a day. Never drink and drive.  Dentist- Brush and floss twice daily; visit your dentist twice a year.  Depression- Your emotional health is as important as your physical health. If you're feeling down, or losing interest in things you would normally enjoy please talk to your healthcare provider.  Eye exam- Visit your eye doctor every year.  Safe sex- If you may be exposed to a sexually transmitted infection, use a condom.  Seat belts- Seat belts can save your life; always wear one.  Smoke/Carbon Monoxide detectors- These detectors need to be installed on the appropriate level of your home.  Replace batteries at least once a year.  Skin cancer- When out in the sun, cover up and use sunscreen 15 SPF or higher.  Violence- If anyone is threatening you, please tell your healthcare provider.  Living Will/ Health care power of attorney- Speak with your healthcare provider and family.   IF you received an x-ray today, you will receive an invoice  from [facility] Radiology. Please contact [facility] Radiology at [phone] with questions or concerns regarding your invoice.   IF you received labwork today, you will receive an invoice from [place]. Please contact [facility] at [phone] with questions or concerns regarding your invoice.   Our billing staff will not be able to assist you with questions regarding bills from these companies.  You will be contacted with the lab  results as soon as they are available. The fastest way to get your results is to activate your My Chart account. Instructions are located on the last page of this paperwork. If you have not heard from [place] regarding the results in 2 weeks, please contact this office.

## 2017-11-06 NOTE — Progress Notes (Signed)
By signing my name below, I, Stephen Obrien, attest that this documentation has been prepared under the direction and in the presence of Dr. Ranell Patrick. Carlota Raspberry.   Electronically Signed: Baldwin [place], Medical Scribe 11/06/2017 at 8:16 AM.  Subjective:  No chief complaint on file.   Patient ID: Stephen Obrien, male    DOB: [birth year], 52 y.o.   MRN: [id number]  HPI Stephen Obrien is a 52 y.o. male who presents to Primary Care at [facility] regarding an annual physical and here for allergies, hyperlipidemia, and insomnia.   1. Seasonal Allergies Has taken Xyzal and Xolair injections, allergist is Dr. [name]. Mr. Broyles notes that he continues to take Xolair every 28 days and that he has stopped taking weekly allergy injections.   2. Hyperlipidemia: hast taken Lipitor 40mg  qd tolerated at last visit. He notes no concerns with his Lipitor and denies any new muscle aches.  Lab Results  Component Value Date   CHOL 149 11/03/2016   HDL 34 (L) 11/03/2016   LDLCALC 90 11/03/2016   TRIG 123 11/03/2016   CHOLHDL 4.4 11/03/2016   Lab Results  Component Value Date   ALT 41 11/03/2016   AST 24 11/03/2016   ALKPHOS 90 11/03/2016   BILITOT 0.5 11/03/2016   3. Insomnia Has used Ambien intermittently 1-2 per week, when discussed last year. Mr. Funke reports that he is still not sleeping very well. He notes that he has increased his Ambien 10mg  use to 3x each week and notes that he wakes up about 6 times each night. He denies having a sleep study in the past and notes that he feels a little groggy each morning; pt also denies snoring at night unless he sleeps on his back (as reported to him by his wife). He notes that he does not consume any caffeine at this time. He notes that on the nights in which he doesn't take Ambien he wakes up at least 6-7 times, but notes that he still feels functional each day.  Cancer Screening: Colonoscopy 7/17. Diverticulosis, internal hemorrhoids; repeat in 10 years. Prostate:    Lab Results  Component Value Date   PSA 0.63 09/10/2015   PSA 0.69 09/09/2014   PSA 0.81 09/06/2013   Skin Cancer Screening: History of eczema and followed by [facility], Dr. Elvera Lennox.   Immunizations: Immunization History  Administered Date(s) Administered  . Influenza, Seasonal, Injecte, Preservative Fre 06/12/2012  . Influenza,inj,Quad PF,6+ Mos 09/06/2013, 09/09/2014, 09/10/2015  . Tdap 04/24/2007   Discussed Shingles vaccination today 11/06/17. The pt denies having had a Shingles vaccination. Pt is also due for Tdap and will receive this today.   Depression Screen: Depression screen [facility] 2/9 11/06/2017 11/03/2016 09/10/2015 08/20/2015 09/09/2014  Decreased Interest 0 0 0 0 0  Down, Depressed, Hopeless 0 0 0 0 0  PHQ - 2 Score 0 0 0 0 0   Vision Screen: Pt is being followed by [employer] once each year, though he missed his visit last year and plans to see them soon.   Dentist: Pt last saw his dentist 3 weeks ago. The pt had oral surgery in August 2018 and notes no concnerns.   Exercise:  Pt exercises moderately 3-4 times each week.   Patient Active Problem List   Diagnosis Date Noted  . Insomnia 09/10/2015  . Allergic urticaria 09/06/2013  . Other and unspecified hyperlipidemia 09/06/2013   Past Medical History:  Diagnosis Date  . Allergy   . Anxiety   . Arthritis   .  Hyperlipidemia    Past Surgical History:  Procedure Laterality Date  . [place]  . VASECTOMY    . WISDOM TOOTH EXTRACTION  1983   No Known Allergies Prior to Admission medications   Medication Sig Start Date End Date Taking? Authorizing Provider  atorvastatin (LIPITOR) 40 MG tablet Take 1 tablet (40 mg total) by mouth daily at 6 PM. 11/03/16   [name], MD  levocetirizine (XYZAL) 5 MG tablet Take 1 tablet (5 mg total) by mouth every evening. 09/10/15   Daub, Loura Back, MD  Omalizumab [name] Gratz) Inject into the skin.    [provider]  SF 1.1 % GEL  dental gel USE IN MOUTH TRAYS AFTER BRUSHING WITH TOOTHPASTE 11/16/15   [provider]  zolpidem (AMBIEN) 10 MG tablet Take 0.5-1 tablets (5-10 mg total) by mouth at bedtime as needed for sleep. 06/14/17   [name], MD   Social History   Socioeconomic History  . Marital status: Married    Spouse name: Not on file  . Number of children: Not on file  . Years of education: Not on file  . Highest education level: Not on file  Occupational History  . Not on file  Social Needs  . Financial resource strain: Not on file  . Food insecurity:    Worry: Not on file    Inability: Not on file  . Transportation needs:    Medical: Not on file    Non-medical: Not on file  Tobacco Use  . Smoking status: Never Smoker  . Smokeless tobacco: Never Used  Substance and Sexual Activity  . Alcohol use: Yes    Alcohol/week: 2.4 oz    Types: 4 Standard drinks or equivalent per week    Comment: wine  . Drug use: No  . Sexual activity: Yes  Lifestyle  . Physical activity:    Days per week: Not on file    Minutes per session: Not on file  . Stress: Not on file  Relationships  . Social connections:    Talks on phone: Not on file    Gets together: Not on file    Attends religious service: Not on file    Active member of club or organization: Not on file    Attends meetings of clubs or organizations: Not on file    Relationship status: Not on file  . Intimate partner violence:    Fear of current or ex partner: Not on file    Emotionally abused: Not on file    Physically abused: Not on file    Forced sexual activity: Not on file  Other Topics Concern  . Not on file  Social History Narrative  . Not on file    Review of Systems Positive for light sensitive eyes, sleep deprived, joint pain, neck pain with arthritis, and seasonal allergies.      Objective:   Physical Exam  Constitutional: He is oriented to person, place, and time. He appears well-developed and well-nourished.  No distress.  HENT:  Head: Normocephalic and atraumatic.  Right Ear: External ear normal.  Left Ear: External ear normal.  Mouth/Throat: Oropharynx is clear and moist.  Eyes: Pupils are equal, round, and reactive to light. Conjunctivae and EOM are normal.  Neck: Normal range of motion. Neck supple. No thyromegaly present.  Cardiovascular: Normal rate, regular rhythm, normal heart sounds and intact distal pulses.  Pulmonary/Chest: Effort normal and breath sounds normal. No respiratory distress. He has  no wheezes.  Abdominal: Soft. He exhibits no distension. There is no tenderness. Hernia confirmed negative in the right inguinal area and confirmed negative in the left inguinal area.  Genitourinary: Prostate normal.  Musculoskeletal: Normal range of motion. He exhibits no edema or tenderness.  Lymphadenopathy:    He has no cervical adenopathy.  Neurological: He is alert and oriented to person, place, and time. He has normal reflexes.  Skin: Skin is warm and dry.  Psychiatric: He has a normal mood and affect. His behavior is normal.  Vitals reviewed.  Vitals:   11/06/17 0823  BP: 122/76  Pulse: 80  Temp: 97.8 F (36.6 C)  TempSrc: Oral  SpO2: 97%  Weight: 242 lb 12.8 oz (110.1 kg)  Height: 6' (1.829 m)    EKG reading; sinus rhythm rate 76. No acute findings.      Assessment & Plan:   Lory Galan is a 52 y.o. male Annual physical exam  - -anticipatory guidance as below in AVS, screening labs above. Health maintenance items as above in HPI discussed/recommended as applicable.   Insomnia, unspecified type - Plan: Ambulatory referral to Sleep Studies  -Based on continued insomnia will refer to sleep studies to see if testing needed.  Ambien refilled for now.  Hyperlipidemia, unspecified hyperlipidemia type - Plan: atorvastatin (LIPITOR) 40 MG tablet, EKG 12-Lead, Comprehensive metabolic panel, Lipid panel  -Tolerating Lipitor, continue same dose.  Labs pending  Screening for  prostate cancer - Plan: PSA  -We discussed pros and cons of prostate cancer screening, and after this discussion, he chose to have screening done. PSA obtained, and no concerning findings on DRE.   Need for shingles vaccine - Plan: Zoster Vaccine Adjuvanted [facility]) injection sent to pharmacy  Need for Tdap vaccination - Plan: Tdap vaccine greater than or equal to 7yo IM given  Screening for cardiovascular condition - Plan: EKG 12-Lead, without concerning findings   Meds ordered this encounter  Medications  . atorvastatin (LIPITOR) 40 MG tablet    Sig: Take 1 tablet (40 mg total) by mouth daily at 6 PM.    Dispense:  90 tablet    Refill:  3  . Zoster Vaccine Adjuvanted [facility]) injection    Sig: Inject 0.5 mLs into the muscle once for 1 dose. Repeat in 2-6 months.    Dispense:  0.5 mL    Refill:  1   Patient Instructions   Thank you for coming in today.  I do not see any concerns in your exam.  I will refer you to the sleep specialist but okay to continue Ambien for now.  No change in cholesterol medication for now.  Lab work should be available within the next 2 weeks on my chart.  Let me know if you have questions in the meantime.  Keeping you healthy  Get these tests  Blood pressure- Have your blood pressure checked once a year by your healthcare provider.  Normal blood pressure is 120/80  Weight- Have your body mass index (BMI) calculated to screen for obesity.  BMI is a measure of body fat based on height and weight. You can also calculate your own BMI at ViewBank[employer].si.  Cholesterol- Have your cholesterol checked every year.  Diabetes- Have your blood sugar checked regularly if you have high blood pressure, high cholesterol, have a family history of diabetes or if you are overweight.  Screening for Colon Cancer- Colonoscopy starting at age 56.  Screening may begin sooner depending on your family history and other  health conditions. Follow up colonoscopy as  directed by your Gastroenterologist.  Screening for Prostate Cancer- Both blood work (PSA) and a rectal exam help screen for Prostate Cancer.  Screening begins at age 23 with African-American men and at age 58 with Caucasian men.  Screening may begin sooner depending on your family history.  Take these medicines  Aspirin- One aspirin daily can help prevent Heart disease and Stroke.  Flu shot- Every fall.  Tetanus- Every 10 years.  Zostavax- Once after the age of 35 to prevent Shingles.  Pneumonia shot- Once after the age of 57; if you are younger than 87, ask your healthcare provider if you need a Pneumonia shot.  Take these steps  Don't smoke- If you do smoke, talk to your doctor about quitting.  For tips on how to quit, go to www.smokefree.gov or call 1-800-QUIT-NOW.  Be physically active- Exercise 5 days a week for at least 30 minutes.  If you are not already physically active start slow and gradually work up to 30 minutes of moderate physical activity.  Examples of moderate activity include walking briskly, mowing the yard, dancing, swimming, bicycling, etc.  Eat a healthy diet- Eat a variety of healthy food such as fruits, vegetables, low fat milk, low fat cheese, yogurt, lean meant, poultry, fish, beans, tofu, etc. For more information go to www.thenutritionsource.org  Drink alcohol in moderation- Limit alcohol intake to less than two drinks a day. Never drink and drive.  Dentist- Brush and floss twice daily; visit your dentist twice a year.  Depression- Your emotional health is as important as your physical health. If you're feeling down, or losing interest in things you would normally enjoy please talk to your healthcare provider.  Eye exam- Visit your eye doctor every year.  Safe sex- If you may be exposed to a sexually transmitted infection, use a condom.  Seat belts- Seat belts can save your life; always wear one.  Smoke/Carbon Monoxide detectors- These detectors need  to be installed on the appropriate level of your home.  Replace batteries at least once a year.  Skin cancer- When out in the sun, cover up and use sunscreen 15 SPF or higher.  Violence- If anyone is threatening you, please tell your healthcare provider.  Living Will/ Health care power of attorney- Speak with your healthcare provider and family.   IF you received an x-ray today, you will receive an invoice from [facility]. Please contact [facility] at [phone] with questions or concerns regarding your invoice.   IF you received labwork today, you will receive an invoice from [facility]. Please contact [facility] at [phone] with questions or concerns regarding your invoice.   Our billing staff will not be able to assist you with questions regarding bills from these companies.  You will be contacted with the lab results as soon as they are available. The fastest way to get your results is to activate your My Chart account. Instructions are located on the last page of this paperwork. If you have not heard from [place] regarding the results in 2 weeks, please contact this office.       I personally performed the services described in this documentation, which was scribed in my presence. The recorded information has been reviewed and considered for accuracy and completeness, addended by me as needed, and agree with information above.  Signed,   [name], MD Primary [facility] at [place].  11/08/17 2:25 PM

## 2017-11-07 LAB — COMPREHENSIVE METABOLIC PANEL
ALBUMIN: 4.7 g/dL (ref 3.5–5.5)
ALT: 31 IU/L (ref 0–44)
AST: 19 IU/L (ref 0–40)
Albumin/Globulin Ratio: 2 (ref 1.2–2.2)
Alkaline Phosphatase: 95 IU/L (ref 39–117)
BILIRUBIN TOTAL: 0.4 mg/dL (ref 0.0–1.2)
BUN / CREAT RATIO: 14 (ref 9–20)
BUN: 14 mg/dL (ref 6–24)
CHLORIDE: 102 mmol/L (ref 96–106)
CO2: 23 mmol/L (ref 20–29)
Calcium: 10.1 mg/dL (ref 8.7–10.2)
Creatinine, Ser: 0.97 mg/dL (ref 0.76–1.27)
GFR calc non Af Amer: 89 mL/min/{1.73_m2} (ref >59)
GFR, EST AFRICAN AMERICAN: 103 mL/min/{1.73_m2} (ref >59)
GLOBULIN, TOTAL: 2.3 g/dL (ref 1.5–4.5)
GLUCOSE: 91 mg/dL (ref 65–99)
Potassium: 4.3 mmol/L (ref 3.5–5.2)
SODIUM: 140 mmol/L (ref 134–144)
TOTAL PROTEIN: 7 g/dL (ref 6.0–8.5)

## 2017-11-07 LAB — PSA: PROSTATE SPECIFIC AG, SERUM: 0.7 ng/mL (ref 0.0–4.0)

## 2017-11-07 LAB — LIPID PANEL
Chol/HDL Ratio: 3.8 ratio (ref 0.0–5.0)
Cholesterol, Total: 148 mg/dL (ref 100–199)
HDL: 39 mg/dL — ABNORMAL LOW (ref >39)
LDL Calculated: 88 mg/dL (ref 0–99)
Triglycerides: 104 mg/dL (ref 0–149)
VLDL CHOLESTEROL CAL: 21 mg/dL (ref 5–40)

## 2017-11-14 DIAGNOSIS — L501 Idiopathic urticaria: Secondary | ICD-10-CM | POA: Diagnosis not present

## 2017-11-15 ENCOUNTER — Ambulatory Visit: Payer: BLUE CROSS/BLUE SHIELD | Admitting: Physician Assistant

## 2017-11-15 ENCOUNTER — Other Ambulatory Visit: Payer: Self-pay

## 2017-11-15 ENCOUNTER — Encounter: Payer: Self-pay | Admitting: Physician Assistant

## 2017-11-15 VITALS — BP 108/70 | HR 86 | Temp 98.9°F | Resp 18 | Ht 72.0 in | Wt 241.4 lb

## 2017-11-15 DIAGNOSIS — J3489 Other specified disorders of nose and nasal sinuses: Secondary | ICD-10-CM | POA: Diagnosis not present

## 2017-11-15 MED ORDER — DOXYCYCLINE HYCLATE 100 MG PO CAPS: 100.0000 mg | ORAL_CAPSULE | Freq: Two times a day (BID) | ORAL | 0 refills | Status: AC

## 2017-11-15 NOTE — Progress Notes (Signed)
11/15/2017 10:35 AM   DOB: Dec 30, 1965 / MRN: [id number]  SUBJECTIVE:  Stephen Obrien is a 52 y.o. male presenting for an eye complaint.  He tells me that for the last 4 to 5 days he has been having pain on the bone just below his eye on the right side.  He denies any visual symptoms at this time and denies frank eye pain or pain with eye movement.  He tells me his vision is perfect.  Denies any frank discharge from the eye itself.  Has a history of severe allergies that requires monthly injections and this is been largely controlled.  He denies any history of diabetes and hypertension.  He has tried some drops left over from a pinkeye infection from 1 of his children and this made no difference in his symptoms whatsoever.  He is allergic to other.   He  has a past medical history of Allergy, Anxiety, Arthritis, and Hyperlipidemia.    He  reports that he has never smoked. He has never used smokeless tobacco. He reports that he drinks about 2.4 oz of alcohol per week. He reports that he does not use drugs. He  reports that he currently engages in sexual activity. The patient  has a past surgical history that includes Vasectomy; Wisdom tooth extraction (1983); and Tear duct probing (1968).  His family history includes Alcohol abuse in his sister; Cancer in his maternal grandfather, maternal grandmother, and paternal grandfather; Depression in his sister; Hyperlipidemia in his mother; Stroke in his father.  Review of Systems  HENT: Positive for sinus pain.   Eyes: Negative for blurred vision, double vision, photophobia, pain, discharge and redness.  Gastrointestinal: Negative for nausea.  Skin: Negative for rash.  Neurological: Negative for dizziness and headaches.    The problem list and medications were reviewed and updated by myself where necessary and exist elsewhere in the encounter.   OBJECTIVE:  BP 108/70 (BP Location: Left Arm, Patient Position: Sitting, Cuff Size: Normal)   Pulse 86    Temp 98.9 F (37.2 C) (Oral)   Resp 18   Ht 6' (1.829 m)   Wt 241 lb 6.4 oz (109.5 kg)   SpO2 97%   BMI 32.74 kg/m   Wt Readings from Last 3 Encounters:  11/15/17 241 lb 6.4 oz (109.5 kg)  11/06/17 242 lb 12.8 oz (110.1 kg)  11/03/16 244 lb (110.7 kg)   Temp Readings from Last 3 Encounters:  11/15/17 98.9 F (37.2 C) (Oral)  11/06/17 97.8 F (36.6 C) (Oral)  11/03/16 98.1 F (36.7 C) (Oral)   BP Readings from Last 3 Encounters:  11/15/17 108/70  11/06/17 122/76  11/03/16 125/85   Pulse Readings from Last 3 Encounters:  11/15/17 86  11/06/17 80  11/03/16 84     Physical Exam  HENT:  Head:    Right Ear: Hearing, tympanic membrane, external ear and ear canal normal.  Left Ear: Hearing, tympanic membrane, external ear and ear canal normal.  Nose: Nose normal. Right sinus exhibits no maxillary sinus tenderness and no frontal sinus tenderness. Left sinus exhibits no maxillary sinus tenderness and no frontal sinus tenderness.  Mouth/Throat: Uvula is midline, oropharynx is clear and moist and mucous membranes are normal. No oropharyngeal exudate, posterior oropharyngeal edema or tonsillar abscesses.  Eyes: Pupils are equal, round, and reactive to light. Conjunctivae are normal.  Cardiovascular: Regular rhythm, S1 normal, S2 normal, normal heart sounds, intact distal pulses and normal pulses. Exam reveals no gallop and no  friction rub.  No murmur heard. Pulmonary/Chest: Effort normal. No stridor. No respiratory distress. He has no wheezes. He has no rales.  Abdominal: He exhibits no distension.  Musculoskeletal: He exhibits no edema.  Lymphadenopathy:       Head (right side): No submandibular and no tonsillar adenopathy present.       Head (left side): No submandibular and no tonsillar adenopathy present.    He has no cervical adenopathy.    No results found for this or any previous visit (from the past 72 hour(s)).  No results found.  ASSESSMENT AND  PLAN:  Stephen Obrien was seen today for eye problem.  Diagnoses and all orders for this visit:  Sinus pain: He most likely has a acute bacterial sinusitis with mild symptoms.  I will treat with doxycycline and have advised that he come back in 2 to 3 weeks if he is not improving.  Advised to come back sooner if he is getting worse on taking medication. -     doxycycline (VIBRAMYCIN) 100 MG capsule; Take 1 capsule (100 mg total) by mouth 2 (two) times daily for 10 days. Take with food. -     Care order/instruction:    The patient is advised to call or return to clinic if he does not see an improvement in symptoms, or to seek the care of the closest emergency department if he worsens with the above plan.   Philis Fendt, MHS, PA-C [facility] at [facility] 11/15/2017 10:35 AM

## 2017-11-15 NOTE — Patient Instructions (Addendum)
Please come back and 2 to 3 weeks if your symptoms do not improve with the doxycycline.  We will likely get imaging started and check some lab work.    IF you received an x-ray today, you will receive an invoice from [facility]. Please contact [facility] at [phone] with questions or concerns regarding your invoice.   IF you received labwork today, you will receive an invoice from [place]. Please contact [facility] at [phone] with questions or concerns regarding your invoice.   Our billing staff will not be able to assist you with questions regarding bills from these companies.  You will be contacted with the lab results as soon as they are available. The fastest way to get your results is to activate your My Chart account. Instructions are located on the last page of this paperwork. If you have not heard from [place] regarding the results in 2 weeks, please contact this office.

## 2017-11-30 DIAGNOSIS — H524 Presbyopia: Secondary | ICD-10-CM | POA: Diagnosis not present

## 2017-12-12 DIAGNOSIS — L509 Urticaria, unspecified: Secondary | ICD-10-CM | POA: Diagnosis not present

## 2017-12-12 DIAGNOSIS — J3089 Other allergic rhinitis: Secondary | ICD-10-CM | POA: Diagnosis not present

## 2017-12-12 DIAGNOSIS — L501 Idiopathic urticaria: Secondary | ICD-10-CM | POA: Diagnosis not present

## 2017-12-12 DIAGNOSIS — J301 Allergic rhinitis due to pollen: Secondary | ICD-10-CM | POA: Diagnosis not present

## 2017-12-19 ENCOUNTER — Other Ambulatory Visit: Payer: Self-pay

## 2017-12-19 ENCOUNTER — Ambulatory Visit (INDEPENDENT_AMBULATORY_CARE_PROVIDER_SITE_OTHER): Payer: BLUE CROSS/BLUE SHIELD | Admitting: Family Medicine

## 2017-12-19 ENCOUNTER — Encounter: Payer: Self-pay | Admitting: Family Medicine

## 2017-12-19 VITALS — BP 116/68 | HR 72 | Temp 98.4°F | Ht 72.5 in | Wt 243.4 lb

## 2017-12-19 DIAGNOSIS — Z1329 Encounter for screening for other suspected endocrine disorder: Secondary | ICD-10-CM

## 2017-12-19 DIAGNOSIS — M25449 Effusion, unspecified hand: Secondary | ICD-10-CM | POA: Diagnosis not present

## 2017-12-19 DIAGNOSIS — Z87440 Personal history of urinary (tract) infections: Secondary | ICD-10-CM

## 2017-12-19 DIAGNOSIS — H0289 Other specified disorders of eyelid: Secondary | ICD-10-CM

## 2017-12-19 LAB — POCT URINALYSIS DIP (MANUAL ENTRY)
BILIRUBIN UA: NEGATIVE
BILIRUBIN UA: NEGATIVE mg/dL
Blood, UA: NEGATIVE
Glucose, UA: NEGATIVE mg/dL
LEUKOCYTES UA: NEGATIVE
Nitrite, UA: NEGATIVE
PH UA: 6.5 (ref 5.0–8.0)
Protein Ur, POC: NEGATIVE mg/dL
SPEC GRAV UA: 1.015 (ref 1.010–1.025)
Urobilinogen, UA: 0.2 E.U./dL

## 2017-12-19 LAB — POC MICROSCOPIC URINALYSIS (UMFC): Mucus: ABSENT

## 2017-12-19 NOTE — Patient Instructions (Addendum)
  I will check some of the screening tests as we discussed.  If testosterone level is low today we would need to repeat that between 8 and 10 in the morning for verification.  I will let you know if other testing is recommended once we see the results of today's testing.  Thank you for coming in today.  Let me know if there are questions in the meantime.    IF you received an x-ray today, you will receive an invoice from [facility]. Please contact [facility] at [phone] with questions or concerns regarding your invoice.   IF you received labwork today, you will receive an invoice from [place]. Please contact [facility] at [phone] with questions or concerns regarding your invoice.   Our billing staff will not be able to assist you with questions regarding bills from these companies.  You will be contacted with the lab results as soon as they are available. The fastest way to get your results is to activate your My Chart account. Instructions are located on the last page of this paperwork. If you have not heard from [place] regarding the results in 2 weeks, please contact this office.

## 2017-12-19 NOTE — Progress Notes (Signed)
Subjective:  By signing my name below, I, Moises Blood, attest that this documentation has been prepared under the direction and in the presence of [name], MD. Electronically Signed: Moises Blood, [place]. 12/19/2017 , 4:38 PM .  Patient was seen in Room 1 .   Patient ID: Stephen Obrien, male    DOB: [birth year], 52 y.o.   MRN: [id number] Chief Complaint  Patient presents with  . right eye lash curl    right side only. wants to check it out. wife noticed it around may 1st    HPI Stephen Obrien is a 52 y.o. male Here for eval of eye lash issue. Patient's wife noticed right eye lash curled up about a month ago. He was seen by Philis Fendt for sinus symptoms on May 1st; reported tenderness around eye and eye lash curled up. Did not think much of concern. Started antibiotic course of doxycycline 100 mg x10 days. He was seen by Hoyle Sauer, OD, at [facility], with May 23rd letter, spontaneous right curled eye lid, no new systematic or topical medications; recommended testing for low testosterone, lupus, and thyroid. Associations with immuno-compromised at times.   Patient denies noticing eye lash changes prior. He denies use of glaucoma or prescription eye drops. He denies using Latisse for thickening eye lashes. He denies hot/cold intolerance, unexplained weight loss/gain, hair changes, new fatigue or energy loss, or trouble with erections. 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 He had HIV testing 20-25 years ago that was negative, and non reactive HIV on 09/10/15. He denies any recent urinary symptoms, dysuria or abdominal pain. He does have a remote history of frequent urinary tract infections when he was 81-50 years old with IVP's. He also reports occasional joint swelling and pain, but improves with Advil; does note history of arthritis.   Shingles vaccine: prescription has been sent to his pharmacy.   Patient Active Problem List   Diagnosis Date Noted  . Insomnia 09/10/2015  .  Allergic urticaria 09/06/2013  . Other and unspecified hyperlipidemia 09/06/2013   Past Medical History:  Diagnosis Date  . Allergy   . Anxiety   . Arthritis   . Hyperlipidemia    Past Surgical History:  Procedure Laterality Date  . [place]  . VASECTOMY    . WISDOM TOOTH EXTRACTION  1983   No Known Allergies Prior to Admission medications   Medication Sig Start Date End Date Taking? Authorizing Provider  Ascorbic Acid (VITAMIN C) 100 MG tablet Take by mouth.    [provider]  atorvastatin (LIPITOR) 40 MG tablet Take 1 tablet (40 mg total) by mouth daily at 6 PM. 11/06/17   [name], MD  Cholecalciferol (VITAMIN D3) 10000 units capsule  05/18/17   [provider]  levocetirizine (XYZAL) 5 MG tablet Take 1 tablet (5 mg total) by mouth every evening. 09/10/15   Daub, Loura Back, MD  Omalizumab [name] [place]) Inject into the skin.    [provider]  SF 1.1 % GEL dental gel USE IN MOUTH TRAYS AFTER BRUSHING WITH TOOTHPASTE 11/16/15   [provider]  zolpidem (AMBIEN) 10 MG tablet Take 0.5-1 tablets (5-10 mg total) by mouth at bedtime as needed for sleep. 06/14/17   [name], MD   Social History   Socioeconomic History  . Marital status: Married    Spouse name: Not on file  . Number of children: Not on file  . Years of education: Not on file  .  Highest education level: Not on file  Occupational History  . Not on file  Social Needs  . Financial resource strain: Not on file  . Food insecurity:    Worry: Not on file    Inability: Not on file  . Transportation needs:    Medical: Not on file    Non-medical: Not on file  Tobacco Use  . Smoking status: Never Smoker  . Smokeless tobacco: Never Used  Substance and Sexual Activity  . Alcohol use: Yes    Alcohol/week: 2.4 oz    Types: 4 Standard drinks or equivalent per week    Comment: wine  . Drug use: No  . Sexual activity: Yes  Lifestyle  . Physical activity:      Days per week: Not on file    Minutes per session: Not on file  . Stress: Not on file  Relationships  . Social connections:    Talks on phone: Not on file    Gets together: Not on file    Attends religious service: Not on file    Active member of club or organization: Not on file    Attends meetings of clubs or organizations: Not on file    Relationship status: Not on file  . Intimate partner violence:    Fear of current or ex partner: Not on file    Emotionally abused: Not on file    Physically abused: Not on file    Forced sexual activity: Not on file  Other Topics Concern  . Not on file  Social History Narrative  . Not on file   Review of Systems  Constitutional: Negative for diaphoresis, fatigue, fever and unexpected weight change.  Eyes: Negative for visual disturbance.       Right eye lash abnormality  Respiratory: Negative for cough, chest tightness and shortness of breath.   Cardiovascular: Negative for chest pain, palpitations and leg swelling.  Gastrointestinal: Negative for abdominal pain and blood in stool.  Endocrine: Negative for cold intolerance and heat intolerance.  Genitourinary: Negative for dysuria.  Musculoskeletal: Positive for arthralgias and joint swelling.  Neurological: Negative for dizziness, light-headedness and headaches.       Objective:   Physical Exam  Constitutional: He is oriented to person, place, and time. He appears well-developed and well-nourished. No distress.  HENT:  Head: Normocephalic and atraumatic.  Eyes: Pupils are equal, round, and reactive to light. EOM are normal.  Right upper eye lid, lashes are turned upwards  Neck: Neck supple.  Cardiovascular: Normal rate.  Pulmonary/Chest: Effort normal. No respiratory distress.  Abdominal: He exhibits no mass. There is no tenderness.  Musculoskeletal: Normal range of motion.  Some prominence of the PIP's in multiple fingers  Neurological: He is alert and oriented to person,  place, and time.  Skin: Skin is warm and dry.  Psychiatric: He has a normal mood and affect. His behavior is normal.  Nursing note and vitals reviewed.   Vitals:   12/19/17 1549  BP: 116/68  Pulse: 72  Temp: 98.4 F (36.9 C)  TempSrc: Oral  SpO2: 98%  Weight: 243 lb 6.4 oz (110.4 kg)  Height: 6' 0.5" (1.842 m)        Assessment & Plan:  Stephen Obrien is a 52 y.o. male Disorder of eyelashes - Plan: TSH, Testosterone, Free, Total, SHBG Screening for thyroid disorder - Plan: TSH History of frequent urinary tract infections - Plan: POCT urinalysis dipstick, POCT Microscopic Urinalysis ([facility]), Testosterone, Free, Total, SHBG Swelling of joint  of hand, unspecified laterality - Plan: ANA,IFA RA Diag Pnl w/rflx Tit/Patn, Sedimentation Rate, C-reactive protein  -Unilateral upper eyelash curling.  Evaluated by his eye care provider.  Some possible concomitant conditions were mentioned.    -Nonreactive HIV testing in 2017 without new risk factors.   - Will screen for hypogonadism with testosterone testing, but discussed need to repeat between 8 and 10 in the morning for verification if that reading is low.  -With intermittent joint swelling in his hands, initially thought to be osteoarthritis, will screen for inflammatory arthritis/rheumatoid arthritis with testing as above.  -Reassuring urinalysis without hematuria, doubt renal disease.   -Screen for thyroid disease with TSH.  No orders of the defined types were placed in this encounter.  Patient Instructions    I will check some of the screening tests as we discussed.  If testosterone level is low today we would need to repeat that between 8 and 10 in the morning for verification.  I will let you know if other testing is recommended once we see the results of today's testing.  Thank you for coming in today.  Let me know if there are questions in the meantime.    IF you received an x-ray today, you will receive an invoice from  [facility]. Please contact [facility] at [phone] with questions or concerns regarding your invoice.   IF you received labwork today, you will receive an invoice from [facility]. Please contact [facility] at [phone] with questions or concerns regarding your invoice.   Our billing staff will not be able to assist you with questions regarding bills from these companies.  You will be contacted with the lab results as soon as they are available. The fastest way to get your results is to activate your My Chart account. Instructions are located on the last page of this paperwork. If you have not heard from [place] regarding the results in 2 weeks, please contact this office.       I personally performed the services described in this documentation, which was scribed in my presence. The recorded information has been reviewed and considered for accuracy and completeness, addended by me as needed, and agree with information above.  Signed,   [name], MD [facility] [place].  12/22/17 9:49 AM

## 2017-12-21 LAB — TESTOSTERONE, FREE, TOTAL, SHBG
Sex Hormone Binding: 40.7 nmol/L (ref 19.3–76.4)
Testosterone, Free: 7.3 pg/mL (ref 7.2–24.0)
Testosterone: 461 ng/dL (ref 264–916)

## 2017-12-21 LAB — SEDIMENTATION RATE: Sed Rate: 7 mm/hr (ref 0–30)

## 2017-12-21 LAB — ANA,IFA RA DIAG PNL W/RFLX TIT/PATN
ANA TITER 1: NEGATIVE
Cyclic Citrullin Peptide Ab: 5 units (ref 0–19)
Rhuematoid fact SerPl-aCnc: <10 IU/mL (ref 0.0–13.9)

## 2017-12-21 LAB — C-REACTIVE PROTEIN: CRP: 0.6 mg/L (ref 0.0–4.9)

## 2017-12-21 LAB — TSH: TSH: 1.65 u[IU]/mL (ref 0.450–4.500)

## 2017-12-22 ENCOUNTER — Encounter: Payer: Self-pay | Admitting: Family Medicine

## 2018-01-03 ENCOUNTER — Encounter: Payer: Self-pay | Admitting: Neurology

## 2018-01-03 ENCOUNTER — Ambulatory Visit (INDEPENDENT_AMBULATORY_CARE_PROVIDER_SITE_OTHER): Payer: BLUE CROSS/BLUE SHIELD | Admitting: Neurology

## 2018-01-03 VITALS — BP 143/85 | HR 77 | Ht 72.0 in | Wt 242.0 lb

## 2018-01-03 DIAGNOSIS — E669 Obesity, unspecified: Secondary | ICD-10-CM

## 2018-01-03 DIAGNOSIS — G479 Sleep disorder, unspecified: Secondary | ICD-10-CM

## 2018-01-03 DIAGNOSIS — R51 Headache: Secondary | ICD-10-CM | POA: Diagnosis not present

## 2018-01-03 DIAGNOSIS — R0683 Snoring: Secondary | ICD-10-CM | POA: Diagnosis not present

## 2018-01-03 DIAGNOSIS — R519 Headache, unspecified: Secondary | ICD-10-CM

## 2018-01-03 NOTE — Progress Notes (Signed)
Subjective:    Patient ID: Stephen Obrien is a 52 y.o. male.  HPI     Stephen Age, MD, PhD [facility] [address], [address] [place], [place] [address]  Dear Dr. Carlota Raspberry,   I saw your patient, Stephen Obrien, upon your kind request in my neurologic clinic today for initial consultation of Stephen Obrien sleep disorder, in particular, difficulty initiating and maintaining sleep, history of snoring. The patient is unaccompanied today. As you know, Stephen Obrien is a 55 year old right-handed gentleman with an underlying medical history of allergic rhinitis, arthritis, anxiety, hyperlipidemia, recurrent hives for which he is on Xolair monthly injections, and obesity, who reports occasional snoring and sleep disturbance including nonrestorative sleep, occasional morning headaches, and difficulty maintaining sleep. I reviewed your office note from 11/06/2017. Stephen Obrien Epworth sleepiness score is 5 out of 24 today, fatigue score is 15 out of 63. He is married and lives with Stephen Obrien wife and 2 children. He is a nonsmoker and drinks alcohol in the form of wine, 3-4 glasses per week on average, he does not typically utilize caffeine on a day-to-day basis. He does not have a family history of sleep apnea. He denies restless leg symptoms or leg twitching at night, has rare sleep talking and had some sleepwalking as a child. He has no significant nocturia, certainly not night to night. He has had occasional morning headaches and reports a fairly strong family history of migraine headaches affecting Stephen Obrien mother and sisters. Stephen Obrien bedtime is around 11 PM and while he does not have difficulty falling asleep generally speaking he does have difficulty maintaining sleep and reports multiple nighttime awakenings, not typically very long at the time. He has been on Ambien generic for some time, used only take it once or twice per week, currently takes it most nights of the week. Stephen Obrien snoring is more pronounced when  he sleeps on Stephen Obrien back, he tries to sleep on Stephen Obrien sides. They have 1 dog in the household, in their bedroom but not on the bed. He has a 8 year old daughter and 41 year old son. 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  Stephen Obrien Past Medical History Is Significant For: Past Medical History:  Diagnosis Date  . Allergy   . Anxiety   . Arthritis   . Hyperlipidemia     Stephen Obrien Past Surgical History Is Significant For: Past Surgical History:  Procedure Laterality Date  . [place]  . VASECTOMY    . [place]    Stephen Obrien Family History Is Significant For: Family History  Problem Relation Obrien of Onset  . Stroke Father   . Depression Sister   . Alcohol abuse Sister   . Hyperlipidemia Mother   . Cancer Maternal Grandmother        leukemia  . Cancer Maternal Grandfather        lung ca  . Cancer Paternal Grandfather        mesothelioma  . Colon cancer Neg Hx     Stephen Obrien Social History Is Significant For: Social History   Socioeconomic History  . Marital status: Married    Spouse name: Not on file  . Number of children: Not on file  . Years of education: Not on file  . Highest education level: Not on file  Occupational History  . Not on file  Social Needs  . Financial resource strain: Not on file  . Food insecurity:    Worry: Not on file    Inability:  Not on file  . Transportation needs:    Medical: Not on file    Non-medical: Not on file  Tobacco Use  . Smoking status: Never Smoker  . Smokeless tobacco: Never Used  Substance and Sexual Activity  . Alcohol use: Yes    Alcohol/week: 2.4 oz    Types: 4 Standard drinks or equivalent per week    Comment: wine  . Drug use: No  . Sexual activity: Yes  Lifestyle  . Physical activity:    Days per week: Not on file    Minutes per session: Not on file  . Stress: Not on file  Relationships  . Social connections:    Talks on phone: Not on file    Gets together: Not on file    Attends religious service: Not on file     Active member of club or organization: Not on file    Attends meetings of clubs or organizations: Not on file    Relationship status: Not on file  Other Topics Concern  . Not on file  Social History Narrative  . Not on file    Stephen Obrien Allergies Are:  No Known Allergies:   Stephen Obrien Current Medications Are:  Outpatient Encounter Medications as of 01/03/2018  Medication Sig  . Ascorbic Acid (VITAMIN C) 100 MG tablet Take by mouth.  Marland Kitchen atorvastatin (LIPITOR) 40 MG tablet Take 1 tablet (40 mg total) by mouth daily at 6 PM.  . Cholecalciferol (VITAMIN D3) 10000 units capsule   . levocetirizine (XYZAL) 5 MG tablet Take 1 tablet (5 mg total) by mouth every evening.  . Omalizumab (XOLAIR [place]) Inject into the skin.  . SF 1.1 % GEL dental gel USE IN MOUTH TRAYS AFTER BRUSHING WITH TOOTHPASTE  . zolpidem (AMBIEN) 10 MG tablet Take 0.5-1 tablets (5-10 mg total) by mouth at bedtime as needed for sleep.   No facility-administered encounter medications on file as of 01/03/2018.   : Review of Systems:  Out of a complete 14 point review of systems, all are reviewed and negative with the exception of these symptoms as listed below:  Review of Systems  Neurological:       Pt presents today to discuss Stephen Obrien sleep. Pt has never had a sleep study but does endorse occasional snoring.  Epworth Sleepiness Scale 0= would never doze 1= slight chance of dozing 2= moderate chance of dozing 3= high chance of dozing  Sitting and reading: 0 Watching TV: 1 Sitting inactive in a public place (ex. Theater or meeting): 1 As a passenger in a car for an hour without a break: 2 Lying down to rest in the afternoon: 1 Sitting and talking to someone: 0 Sitting quietly after lunch (no alcohol): 0 In a car, while stopped in traffic: 0 Total: 5     Objective:  Neurological Exam  Physical Exam Physical Examination:   Vitals:   01/03/18 0904  BP: (!) 143/85  Pulse: 77    General Examination: The patient is a very  pleasant 52 y.o. male in no acute distress. He appears well-developed and well-nourished and well groomed.   HEENT: Normocephalic, atraumatic, pupils are equal, round and reactive to light and accommodation. Curly eye lashes OD, not OS.  Extraocular tracking is good without limitation to gaze excursion or nystagmus noted. Normal smooth pursuit is noted. Hearing is grossly intact. Face is symmetric with normal facial animation and normal facial sensation. Speech is clear with no dysarthria noted. There is no hypophonia. There is no  lip, neck/head, jaw or voice tremor. Neck is supple with full range of passive and active motion. There are no carotid bruits on auscultation. Oropharynx exam reveals: mild mouth dryness, adequate dental hygiene and moderate airway crowding, due to smaller airway entry and larger uvula. Tonsils are small. Mallampati is class III. Tongue protrudes centrally and palate elevates symmetrically. Neck circumference is 18-5/8 inches. He has a mild overbite. Nasal inspection shows benign findings, no significant swelling or inferior turbinate hypertrophy or septal deviation.  Chest: Clear to auscultation without wheezing, rhonchi or crackles noted.  Heart: S1+S2+0, regular and normal without murmurs, rubs or gallops noted.   Abdomen: Soft, non-tender and non-distended with normal bowel sounds appreciated on auscultation.  Extremities: There is no pitting edema in the distal lower extremities bilaterally.  Skin: Warm and dry without trophic changes noted.  Musculoskeletal: exam reveals no obvious joint deformities, tenderness or joint swelling or erythema, mild deformity R 5th digit from recurrent injuries.   Neurologically:  Mental status: The patient is awake, alert and oriented in all 4 spheres. Stephen Obrien immediate and remote memory, attention, language skills and fund of knowledge are appropriate. There is no evidence of aphasia, agnosia, apraxia or anomia. Speech is clear with  normal prosody and enunciation. Thought process is linear. Mood is normal and affect is normal.  Cranial nerves II - XII are as described above under HEENT exam. In addition: shoulder shrug is normal with equal shoulder height noted. Motor exam: Normal bulk, strength and tone is noted. There is no drift, tremor or rebound. Romberg is negative. Reflexes are 1+ throughout. Fine motor skills and coordination: intact with normal finger taps, normal hand movements, normal rapid alternating patting, normal foot taps and normal foot agility.  Cerebellar testing: No dysmetria or intention tremor. There is no truncal or gait ataxia.  Sensory exam: intact to light touch in the upper and lower extremities.  Gait, station and balance: He stands easily. No veering to one side is noted. No leaning to one side is noted. Posture is Obrien-appropriate and stance is narrow based. Gait shows normal stride length and normal pace. No problems turning are noted. Tandem walk is unremarkable.   Assessment and Plan:   In summary, Stephen Obrien is a very pleasant 66 y.o.-year old male  with an underlying medical history of allergic rhinitis, arthritis, anxiety, hyperlipidemia, recurrent hives for which he is on Xolair monthly injections, and obesity, whose history and physical exam are concerning for obstructive sleep apnea (OSA). While he does not develop telltale history of crescendo snoring and gasping for air or breathing pauses that are witnessed, he does report some snoring and sleep disruption. I would recommend we proceed with a lab attended sleep study to rule out an organic cause of Stephen Obrien sleep related complaints. a long chat with the patient about my findings and the diagnosis of OSA, its prognosis and treatment options. We talked about medical treatments, surgical interventions and non-pharmacological approaches. I explained in particular the risks and ramifications of untreated moderate to severe OSA, especially with respect  to developing cardiovascular disease down the Road, including congestive heart failure, difficult to treat hypertension, cardiac arrhythmias, or stroke. Even type 2 diabetes has, in part, been linked to  OSA. Symptoms of untreated OSA include daytime sleepiness, memory problems, mood irritability and mood disorder such as depression and anxiety, lack of energy, as well as recurrent headaches, especially morning headaches. We talked about trying to maintain a healthy lifestyle in general, as well as the  importance of weight control. I encouraged the patient to eat healthy, exercise daily and keep well hydrated, to keep a scheduled bedtime and wake time routine, to not skip any meals and eat healthy snacks in between meals. I advised the patient not to drive when feeling sleepy. I recommended the following at this time: sleep study with potential positive airway pressure titration. (We will score hypopneas at 3%).   I explained the sleep test procedure to the patient and also outlined possible surgical and non-surgical treatment options of OSA, including the use of a custom-made dental device (which would require a referral to a specialist dentist or oral surgeon), upper airway surgical options, such as pillar implants, radiofrequency surgery, tongue base surgery, and UPPP (which would involve a referral to an ENT surgeon). Rarely, jaw surgery such as mandibular advancement may be considered.  I also explained the CPAP treatment option to the patient, who indicated that he would be willing to try CPAP if the need arises. I explained the importance of being compliant with PAP treatment, not only for insurance purposes but primarily to improve Stephen Obrien symptoms, and for the patient's long term health benefit, including to reduce Stephen Obrien cardiovascular risks. I answered all Stephen Obrien questions today and the patient was in agreement. I would like to see him back after the sleep study is completed and encouraged him to call with  any interim questions, concerns, problems or updates.   Thank you very much for allowing me to participate in the care of this nice patient. If I can be of any further assistance to you please do not hesitate to call me at [phone].  Sincerely,   Stephen Age, MD, PhD

## 2018-01-03 NOTE — Patient Instructions (Addendum)
Based on your symptoms and your exam I believe we should look for an underlying organic sleep disorder, such as obstructive sleep apnea/OSA, or dream enactments, or periodic leg movement disorder (PLMD), which is leg twitching in sleep.   At this point, we should proceed with a sleep study to further delineate your sleep-related issues.   If you have more than mild OSA, I want you to consider treatment with CPAP. Please remember, the risks and ramifications of moderate to severe obstructive sleep apnea or OSA are: Cardiovascular disease, including congestive heart failure, stroke, difficult to control hypertension, arrhythmias, and even type 2 diabetes has been linked to untreated OSA. Sleep apnea causes disruption of sleep and sleep deprivation in most cases, which, in turn, can cause recurrent headaches, problems with memory, mood, concentration, focus, and vigilance. Most people with untreated sleep apnea report excessive daytime sleepiness, which can affect their ability to drive. Please do not drive if you feel sleepy.   I will see you back after your sleep study to go over the test results and where to go from there. We will call you after your sleep study to advise about the results (most likely, you will hear from [place], my nurse) and to set up an appointment at the time.    Our sleep lab administrative assistant will call you to schedule your sleep study. If you don't hear back from her by about 2 weeks from now, please feel free to call her at [phone]. This is her direct line and please leave a message with your phone number to call back if you get the voicemail box. She will call back as soon as possible.

## 2018-01-04 ENCOUNTER — Telehealth: Payer: Self-pay

## 2018-01-04 DIAGNOSIS — R0683 Snoring: Secondary | ICD-10-CM

## 2018-01-04 DIAGNOSIS — G479 Sleep disorder, unspecified: Secondary | ICD-10-CM

## 2018-01-04 NOTE — Telephone Encounter (Signed)
VO for HST from Dr. Athar received. HST order placed.  

## 2018-01-04 NOTE — Telephone Encounter (Signed)
[employer] denied in lab sleep study, need HST order 

## 2018-01-09 DIAGNOSIS — L501 Idiopathic urticaria: Secondary | ICD-10-CM | POA: Diagnosis not present

## 2018-01-26 ENCOUNTER — Other Ambulatory Visit: Payer: Self-pay | Admitting: Family Medicine

## 2018-01-26 DIAGNOSIS — G4700 Insomnia, unspecified: Secondary | ICD-10-CM

## 2018-01-26 NOTE — Telephone Encounter (Signed)
LOV  12/19/17 Dr. Carlota Raspberry Last refill 06/14/17  # 90 with 1 refill

## 2018-01-27 NOTE — Telephone Encounter (Signed)
Ambien refilled.  Reviewed recent sleep study evaluation and plan for home sleep test.

## 2018-02-06 DIAGNOSIS — L509 Urticaria, unspecified: Secondary | ICD-10-CM | POA: Diagnosis not present

## 2018-02-13 DIAGNOSIS — J209 Acute bronchitis, unspecified: Secondary | ICD-10-CM | POA: Diagnosis not present

## 2018-02-15 ENCOUNTER — Ambulatory Visit: Payer: BLUE CROSS/BLUE SHIELD | Admitting: Neurology

## 2018-02-15 DIAGNOSIS — R0683 Snoring: Secondary | ICD-10-CM

## 2018-02-15 DIAGNOSIS — G4734 Idiopathic sleep related nonobstructive alveolar hypoventilation: Secondary | ICD-10-CM

## 2018-02-15 DIAGNOSIS — G479 Sleep disorder, unspecified: Secondary | ICD-10-CM | POA: Diagnosis not present

## 2018-02-15 DIAGNOSIS — G4733 Obstructive sleep apnea (adult) (pediatric): Secondary | ICD-10-CM | POA: Diagnosis not present

## 2018-02-17 DIAGNOSIS — J209 Acute bronchitis, unspecified: Secondary | ICD-10-CM | POA: Diagnosis not present

## 2018-02-20 ENCOUNTER — Telehealth: Payer: Self-pay | Admitting: Neurology

## 2018-02-20 NOTE — Progress Notes (Signed)
Patient referred by Dr. Carlota Raspberry, seen by me on 01/03/18, HST on 02/18/18  Please call and notify the patient that the recent home sleep test showed obstructive sleep apnea in the moderate range. While I recommend treatment for this in the form CPAP, his insurance will not approve a sleep study for this. They will likely only approve a trial of autoPAP, which means, that we don't have to bring him in for a sleep study with CPAP, but will let him try an autoPAP machine at home, through a DME company (of his choice, or as per insurance requirement). The DME representative will educate him on how to use the machine, how to put the mask on, etc. I have placed an order in the chart. Please send referral, talk to patient, send report to referring MD. We will need a FU in sleep clinic for 10 weeks post-PAP set up, please arrange that with me or one of our NPs. Thanks,   Star Age, MD, PhD [facility])

## 2018-02-20 NOTE — Telephone Encounter (Signed)
I called pt. I advised pt that Dr. Rexene Alberts reviewed their sleep study results and found that pt home sleep test. Dr. Rexene Alberts recommends that pt starts auto CPAP. I reviewed PAP compliance expectations with the pt. Pt is agreeable to starting a CPAP. I advised pt that an order will be sent to a DME, [facility], and [facility] will call the pt within about one week after they file with the pt's insurance. [facility] will show the pt how to use the machine, fit for masks, and troubleshoot the CPAP if needed. A follow up appt was made for insurance purposes with Dr. Rexene Alberts on Oct 16,2019 at 10:30 am . Pt verbalized understanding to arrive 15 minutes early and bring their CPAP. A letter with all of this information in it will be mailed to the pt as a reminder. I verified with the pt that the address we have on file is correct. Pt verbalized understanding of results. Pt had no questions at this time but was encouraged to call back if questions arise.

## 2018-02-20 NOTE — Telephone Encounter (Signed)
-----   Message from Star Age, MD sent at 02/20/2018  8:30 AM EDT ----- Patient referred by Dr. Carlota Raspberry, seen by me on 01/03/18, HST on 02/18/18  Please call and notify the patient that the recent home sleep test showed obstructive sleep apnea in the moderate range. While I recommend treatment for this in the form CPAP, his insurance will not approve a sleep study for this. They will likely only approve a trial of autoPAP, which means, that we don't have to bring him in for a sleep study with CPAP, but will let him try an autoPAP machine at home, through a DME company (of his choice, or as per insurance requirement). The DME representative will educate him on how to use the machine, how to put the mask on, etc. I have placed an order in the chart. Please send referral, talk to patient, send report to referring MD. We will need a FU in sleep clinic for 10 weeks post-PAP set up, please arrange that with me or one of our NPs. Thanks,   Star Age, MD, PhD [facility])

## 2018-02-20 NOTE — Addendum Note (Signed)
Addended by: Star Age on: 02/20/2018 08:30 AM   Modules accepted: Orders

## 2018-02-20 NOTE — Procedures (Signed)
[facility] @[facility] [place], [place] [address] NAME: Stephen Obrien                                                                 DOB: Jan 16, 1966 MEDICAL RECORD NUMBER [id number]                                              DOS:  02/18/18 REFERRING PHYSICIAN: Cindee Lame, MD STUDY PERFORMED: Home Sleep Test  HISTORY: 52 year old man with a history of allergic rhinitis, arthritis, anxiety, hyperlipidemia, recurrent hives, and obesity, who reports snoring, sleep disturbance including nonrestorative sleep, morning headaches, and difficulty maintaining sleep. His Epworth sleepiness score is 5 out of 24 today, BMI:31.9.   STUDY RESULTS:  Total Recording Time: 10 hours, 35 minutes (valid test time: 6 hours, 9 min) Total Apnea/Hypopnea Index (AHI): 21.1/h, RDI: 24.6/h Average Oxygen Saturation: 92%, Lowest Oxygen Desaturation: 79%  Total Time Oxygen Saturation Below or at 88%:  60 minutes  Average Heart Rate: 73 bpm (between 55 and 106 bpm) IMPRESSION: OSA; Nocturnal Hypoxemia RECOMMENDATION: This home sleep test demonstrates moderate obstructive sleep apnea with a total AHI of 21.1/hour and O2 nadir of 79%. There is evidence of nocturnal hypoxemia with significant time below 89% saturation of 1 hour. Given the patient's medical history and sleep related complaints, treatment with positive airway pressure (in the form of CPAP) is recommended. This will require a full night CPAP titration study for proper treatment settings, O2 monitoring and mask fitting. Based on the severity of the sleep disordered breathing an attended titration study is indicated. However, patient's insurance has denied an attended sleep study; therefore, the patient will be advised to proceed with an autoPAP titration/trial at home for now. Please note that untreated obstructive sleep apnea may carry additional perioperative morbidity. Patients with significant obstructive sleep apnea should  receive perioperative PAP therapy and the surgeons and particularly the anesthesiologist should be informed of the diagnosis and the severity of the sleep disordered breathing. The patient should be cautioned not to drive, work at heights, or operate dangerous or heavy equipment when tired or sleepy. Review and reiteration of good sleep hygiene measures should be pursued with any patient. Other causes of the patient's symptoms, including circadian rhythm disturbances, an underlying mood disorder, medication effect and/or an underlying medical problem cannot be ruled out based on this test. Clinical correlation is recommended. The patient and his referring provider will be notified of the test results. The patient will be seen in follow up in sleep clinic at [facility]. I certify that I have reviewed the raw data recording prior to the issuance of this report in accordance with the standards of the American Academy of Sleep Medicine (AASM).  Star Age, MD, PhD [facility]; Farrel Conners, Diplomat, ABPN (Neurology and Sleep)

## 2018-03-06 DIAGNOSIS — L501 Idiopathic urticaria: Secondary | ICD-10-CM | POA: Diagnosis not present

## 2018-03-13 DIAGNOSIS — G4733 Obstructive sleep apnea (adult) (pediatric): Secondary | ICD-10-CM | POA: Diagnosis not present

## 2018-04-03 DIAGNOSIS — L501 Idiopathic urticaria: Secondary | ICD-10-CM | POA: Diagnosis not present

## 2018-04-13 DIAGNOSIS — G4733 Obstructive sleep apnea (adult) (pediatric): Secondary | ICD-10-CM | POA: Diagnosis not present

## 2018-05-01 DIAGNOSIS — L501 Idiopathic urticaria: Secondary | ICD-10-CM | POA: Diagnosis not present

## 2018-05-02 ENCOUNTER — Ambulatory Visit: Payer: Self-pay | Admitting: Neurology

## 2018-05-08 ENCOUNTER — Other Ambulatory Visit: Payer: Self-pay | Admitting: Family Medicine

## 2018-05-08 DIAGNOSIS — L300 Nummular dermatitis: Secondary | ICD-10-CM | POA: Diagnosis not present

## 2018-05-08 DIAGNOSIS — G4700 Insomnia, unspecified: Secondary | ICD-10-CM

## 2018-05-08 NOTE — Telephone Encounter (Signed)
Sleep study in August with plan CPAP.  Should require medication less, #30 provided, but please follow-up with me before that has run out to discuss treatment of insomnia further.  Mychart message sent

## 2018-05-08 NOTE — Telephone Encounter (Signed)
Requested medication (s) are due for refill today: yes  Requested medication (s) are on the active medication list: yes  Last refill:  01/27/18 #30  Future visit scheduled: No  Notes to clinic:  Last office visit 12/19/17    Requested Prescriptions  Pending Prescriptions Disp Refills   zolpidem (AMBIEN) 10 MG tablet [Pharmacy Med Name: ZOLPIDEM 10MG  TABLETS] 90 tablet 0    Sig: TAKE 1/2 TO 1 TABLET BY MOUTH EVERY NIGHT AT BEDTIME AS NEEDED FOR SLEEP     Not Delegated - Psychiatry:  Anxiolytics/Hypnotics Failed - 05/08/2018 10:42 AM      Failed - This refill cannot be delegated      Failed - Urine Drug Screen completed in last 360 days.      Passed - Valid encounter within last 6 months    Recent Outpatient Visits          4 months ago Screening for thyroid disorder   [facility], Ranell Patrick, MD   5 months ago Sinus pain   [facility] [place], PA-C   6 months ago Annual physical exam   [facility], Ranell Patrick, MD   1 year ago Annual physical exam   [facility], Ranell Patrick, MD   2 years ago Annual physical exam   [facility], Loura Back, MD

## 2018-05-13 DIAGNOSIS — G4733 Obstructive sleep apnea (adult) (pediatric): Secondary | ICD-10-CM | POA: Diagnosis not present

## 2018-05-27 ENCOUNTER — Encounter: Payer: Self-pay | Admitting: Neurology

## 2018-05-29 ENCOUNTER — Ambulatory Visit (INDEPENDENT_AMBULATORY_CARE_PROVIDER_SITE_OTHER): Payer: BLUE CROSS/BLUE SHIELD | Admitting: Neurology

## 2018-05-29 ENCOUNTER — Encounter: Payer: Self-pay | Admitting: Neurology

## 2018-05-29 VITALS — BP 141/82 | HR 76 | Ht 72.5 in | Wt 244.0 lb

## 2018-05-29 DIAGNOSIS — Z9989 Dependence on other enabling machines and devices: Secondary | ICD-10-CM

## 2018-05-29 DIAGNOSIS — G4733 Obstructive sleep apnea (adult) (pediatric): Secondary | ICD-10-CM | POA: Diagnosis not present

## 2018-05-29 DIAGNOSIS — G4734 Idiopathic sleep related nonobstructive alveolar hypoventilation: Secondary | ICD-10-CM

## 2018-05-29 DIAGNOSIS — L501 Idiopathic urticaria: Secondary | ICD-10-CM | POA: Diagnosis not present

## 2018-05-29 NOTE — Progress Notes (Signed)
Subjective:    Obrien ID: Stephen Obrien is a 52 y.o. male.  HPI     Interim history:   Stephen Obrien is a 16 year old right-handed gentleman with an underlying medical history of allergic rhinitis, arthritis, anxiety, hyperlipidemia, recurrent hives for which Stephen Obrien is on Xolair monthly injections, and obesity, who presents for follow-up consultation of Stephen Obrien obstructive sleep apnea after home sleep testing and started AutoPap therapy. Stephen Obrien is unaccompanied today. I first met Stephen Obrien on 01/03/2018 at Stephen request of Stephen Obrien primary care physician, at which time Stephen Obrien reported snoring and sleep difficulty including not waking up rested and morning headaches. Stephen Obrien was advised to proceed with sleep study testing. Stephen Obrien had a home sleep test as insurance denied a laboratory attended sleep study. Home sleep test from 02/18/2018 showed moderate obstructive sleep apnea with an AHI of 21.1 per hour, average oxygen saturation of 92%, nadir of 79%. Time below or at 88% saturation was 60 minutes. Stephen Obrien was advised to proceed with AutoPap therapy as insurance denied a laboratory attended sleep study.  Today, 05/29/2018: I reviewed Stephen Obrien AutoPap compliance data from 04/28/2018 through 05/27/2018 which is a total of 30 days, during which time Stephen Obrien used Stephen Obrien AutoPap 24 days with percent used days greater than 4 hours at 73%, indicating adequate compliance with an average usage of 7 hours and 24 minutes for days on treatment, residual AHI at goal at 1.7 per hour, 95th percentile pressure right at 11 cm, leak acceptable for Stephen 95th percentile at 13.6 L/m on a pressure range of 7 cm to 15 cm. Stephen Obrien reports being compliant with treatment, Stephen Obrien has had Stephen occasional lapse in treatment because Stephen Obrien needed a break from Stephen mask. Stephen Obrien has some discomfort from Stephen nasal mask and has some areas of redness, Stephen Obrien is prone to hives and allergies to begin with. It does not actually have an allergic reaction. Sometimes Stephen air pressure goes up Stephen Obrien tear ducts  and bothers one of Stephen Obrien eyes. Nevertheless, Stephen Obrien is motivated to continue with treatment. Stephen Obrien does not necessarily sleep that much better. Stephen Obrien is still taking Ambien generic, 5 mg each night on average. Stephen Obrien would be willing to try melatonin. Stephen Obrien is also open to trying a different nasal interface. Stephen Obrien is not typically a mouth breather and leak information does not suggest mouth opening per se.  Stephen Obrien's allergies, current medications, family history, past medical history, past social history, past surgical history and problem list were reviewed and updated as appropriate.   Previously (copied from previous notes for reference):   01/03/2018: (Stephen Obrien) reports occasional snoring and sleep disturbance including nonrestorative sleep, occasional morning headaches, and difficulty maintaining sleep. I reviewed your office note from 11/06/2017. Stephen Obrien Epworth sleepiness score is 5 out of 24 today, fatigue score is 15 out of 63. Stephen Obrien is married and lives with Stephen Obrien wife and 2 children. Stephen Obrien is a nonsmoker and drinks alcohol in Stephen form of wine, 3-4 glasses per week on average, Stephen Obrien does not typically utilize caffeine on a day-to-day basis. Stephen Obrien does not have a family history of sleep apnea. Stephen Obrien denies restless leg symptoms or leg twitching at night, has rare sleep talking and had some sleepwalking as a child. Stephen Obrien has no significant nocturia, certainly not night to night. Stephen Obrien has had occasional morning headaches and reports a fairly strong family history of migraine headaches affecting Stephen Obrien mother and sisters. Stephen Obrien bedtime is around 11 PM and while Stephen Obrien does not have difficulty falling asleep generally speaking  Stephen Obrien does have difficulty maintaining sleep and reports multiple nighttime awakenings, not typically very long at Stephen time. Stephen Obrien has been on Ambien generic for some time, used only take it once or twice per week, currently takes it most nights of Stephen week. Stephen Obrien snoring is more pronounced when Stephen Obrien sleeps on Stephen Obrien back, Stephen Obrien tries to sleep on Stephen Obrien  sides. They have 1 dog in Stephen household, in their bedroom but not on Stephen bed. Stephen Obrien has a 39 year old daughter and 67 year old son. 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   Stephen Obrien Past Medical History Is Significant For: Past Medical History:  Diagnosis Date  . Allergy   . Anxiety   . Arthritis   . Hyperlipidemia     Stephen Obrien Past Surgical History Is Significant For: Past Surgical History:  Procedure Laterality Date  . [place]  . VASECTOMY    . [place]    Stephen Obrien Family History Is Significant For: Family History  Problem Relation Age of Onset  . Stroke Father   . Depression Sister   . Alcohol abuse Sister   . Hyperlipidemia Mother   . Cancer Maternal Grandmother        leukemia  . Cancer Maternal Grandfather        lung ca  . Cancer Paternal Grandfather        mesothelioma  . Colon cancer Neg Hx     Stephen Obrien Social History Is Significant For: Social History   Socioeconomic History  . Marital status: Married    Spouse name: Not on file  . Number of children: Not on file  . Years of education: Not on file  . Highest education level: Not on file  Occupational History  . Not on file  Social Needs  . Financial resource strain: Not on file  . Food insecurity:    Worry: Not on file    Inability: Not on file  . Transportation needs:    Medical: Not on file    Non-medical: Not on file  Tobacco Use  . Smoking status: Never Smoker  . Smokeless tobacco: Never Used  Substance and Sexual Activity  . Alcohol use: Yes    Alcohol/week: 4.0 standard drinks    Types: 4 Standard drinks or equivalent per week    Comment: wine  . Drug use: No  . Sexual activity: Yes  Lifestyle  . Physical activity:    Days per week: Not on file    Minutes per session: Not on file  . Stress: Not on file  Relationships  . Social connections:    Talks on phone: Not on file    Gets together: Not on file    Attends religious service: Not on file    Active member of club or  organization: Not on file    Attends meetings of clubs or organizations: Not on file    Relationship status: Not on file  Other Topics Concern  . Not on file  Social History Narrative  . Not on file    Stephen Obrien Allergies Are:  No Known Allergies:   Stephen Obrien Current Medications Are:  Outpatient Encounter Medications as of 05/29/2018  Medication Sig  . Ascorbic Acid (VITAMIN C) 100 MG tablet Take by mouth.  Marland Kitchen atorvastatin (LIPITOR) 40 MG tablet Take 1 tablet (40 mg total) by mouth daily at 6 PM.  . Cholecalciferol (VITAMIN D3) 10000 units capsule   . levocetirizine (XYZAL) 5 MG tablet Take 1 tablet (5 mg total)  by mouth every evening.  . Omalizumab (XOLAIR [place]) Inject 150 mg into Stephen skin.   . SF 1.1 % GEL dental gel USE IN MOUTH TRAYS AFTER BRUSHING WITH TOOTHPASTE  . zolpidem (AMBIEN) 10 MG tablet TAKE 1/2 TO 1 TABLET BY MOUTH EVERY NIGHT AT BEDTIME AS NEEDED FOR SLEEP   No facility-administered encounter medications on file as of 05/29/2018.   :  Review of Systems:  Out of a complete 14 point review of systems, all are reviewed and negative with Stephen exception of these symptoms as listed below: Review of Systems  Neurological:       Pt presents today to discuss Stephen Obrien cpap. Pt reports that Stephen Obrien is getting less sleep.    Objective:  Neurological Exam  Physical Exam Physical Examination:   Vitals:   05/29/18 1027  BP: (!) 141/82  Pulse: 76    General Examination: Stephen Obrien is a very pleasant 52 y.o. male in no acute distress. Stephen Obrien appears well-developed and well-nourished and well groomed.   HEENT: Normocephalic, atraumatic, pupils are equal, round and reactive to light and accommodation. Extraocular tracking is good without limitation to gaze excursion or nystagmus noted. Normal smooth pursuit is noted. Hearing is grossly intact. Face is symmetric with normal facial animation and normal facial sensation. Speech is clear with no dysarthria noted. There is no hypophonia. There is no lip,  neck/head, jaw or voice tremor. Neck with FROM. Oropharynx exam reveals: mild mouth dryness, adequate dental hygiene and moderate airway crowding Mallampati is class III. Tongue protrudes centrally and palate elevates symmetrically. Mild redness noted perinasally bilaterally.  Chest: Clear to auscultation without wheezing, rhonchi or crackles noted.  Heart: S1+S2+0, regular and normal without murmurs, rubs or gallops noted.   Abdomen: Soft, non-tender and non-distended with normal bowel sounds appreciated on auscultation.  Extremities: There is no pitting edema in Stephen distal lower extremities bilaterally.  Skin: Warm and dry without trophic changes noted.  Musculoskeletal: exam reveals no obvious joint deformities, tenderness or joint swelling or erythema, mild deformity R 5th digit from recurrent injuries, stable.   Neurologically:  Mental status: Stephen Obrien is awake, alert and oriented in all 4 spheres. Stephen Obrien immediate and remote memory, attention, language skills and fund of knowledge are appropriate. There is no evidence of aphasia, agnosia, apraxia or anomia. Speech is clear with normal prosody and enunciation. Thought process is linear. Mood is normal and affect is normal.  Cranial nerves II - XII are as described above under HEENT exam.  Motor exam: Normal bulk, strength and tone is noted. There is no drift, tremor or rebound. Fine motor skills and coordination: intact with normal finger taps, normal hand movements, normal rapid alternating patting, normal foot taps and normal foot agility.  Cerebellar testing: No dysmetria or intention tremor. There is no truncal or gait ataxia.  Sensory exam: intact to light touch.  Gait, station and balance: Stephen Obrien stands easily. No veering to one side is noted. No leaning to one side is noted. Posture is age-appropriate and stance is narrow based. Gait shows normal stride length and normal pace. No problems turning are noted  Assessment and Plan:    In summary, Stephen Obrien is a very pleasant 52 year old male with an underlying medical history of allergic rhinitis, arthritis, anxiety, hyperlipidemia, recurrent hives for which Stephen Obrien is on Xolair monthly injections, and obesity, who presents for follow-up consultation of Stephen Obrien obstructive sleep apnea which was deemed to be in Stephen moderate range by home sleep testing in  August 2019. Stephen Obrien has established treatment with AutoPap therapy at home and is compliant with it. Stephen Obrien is commended for Stephen Obrien treatment adherence. Stephen Obrien is still adjusting to treatment and does not give a telltale improvement in Stephen Obrien sleep quality but is able to go back to sleep when Stephen Obrien wakes up in Stephen middle of Stephen night. Stephen Obrien is advised to try melatonin before bedtime, 1 mg tablet 3 mg, 1 hour to 2 hours before Stephen Obrien projective bedtime. Stephen Obrien can try this in lieu of Ambien if possible. We talked about Stephen importance of treating sleep apnea. Furthermore, I would like to proceed with an overnight pulse oximetry test to make sure Stephen Obrien oxygen saturations are adequate while Stephen Obrien is on AutoPap therapy as Stephen Obrien did have desaturations into Stephen lower 80s and as low as 79% during Stephen Obrien home sleep test with time below 89% saturation of 60 minutes at Stephen time. Stephen Obrien would be willing to pursue this. We will order this through Stephen Obrien current DME company and call Stephen Obrien with Stephen results. If all goes well I would like to see Stephen Obrien back in 6 months. Stephen Obrien is advised to talk to Stephen Obrien DME company about replacement supplies as Stephen Obrien is due for supplies and Stephen Obrien may be able to try a different nasal interface such as nasal cushion rather than nasal mask. I answered all Stephen Obrien questions today and Stephen Obrien was in agreement. I spent 25 minutes in total face-to-face time with Stephen Obrien, more than 50% of which was spent in counseling and coordination of care, reviewing test results, reviewing medication and discussing or reviewing Stephen diagnosis of OSA, its prognosis and treatment options. Pertinent laboratory and imaging  test results that were available during this visit with Stephen Obrien were reviewed by me and considered in my medical decision making (see chart for details).

## 2018-05-29 NOTE — Patient Instructions (Addendum)
Please continue using your autoPAP regularly. While your insurance requires that you use PAP at least 4 hours each night on 70% of the nights, I recommend, that you not skip any nights and use it throughout the night if you can. Getting used to PAP and staying with the treatment long term does take time and patience and discipline. Untreated obstructive sleep apnea when it is moderate to severe can have an adverse impact on cardiovascular health and raise her risk for heart disease, arrhythmias, hypertension, congestive heart failure, stroke and diabetes. Untreated obstructive sleep apnea causes sleep disruption, nonrestorative sleep, and sleep deprivation. This can have an impact on your day to day functioning and cause daytime sleepiness and impairment of cognitive function, memory loss, mood disturbance, and problems focussing. Using PAP regularly can improve these symptoms.   As discussed, we will do an overnight oxygen level test, called ONO, and your DME company will call and set this up for one night, while you also use your autoPAP as usual. We will call you with the results. This is to make sure that your oxygen levels stay in the 90s, while you are treated with autoPAP for your OSA. Remember, your oxygen levels dropped into the lower 80s and as low as 79% during the home sleep test.   You can try Melatonin at night for sleep: take 1 mg to 3 mg, one to 2 hours before your bedtime. You can go up to 5 mg if needed. It is over the counter and comes in pill form, chewable form and spray, if you prefer.

## 2018-05-29 NOTE — Progress Notes (Signed)
Order for ONO and cpap supplies sent to [facility] via community message in EP[employer]. Confirmation received that the order transmitted was successful.

## 2018-06-17 HISTORY — PX: DISTAL BICEPS TENDON REPAIR: SHX1461

## 2018-06-26 DIAGNOSIS — S46211A Strain of muscle, fascia and tendon of other parts of biceps, right arm, initial encounter: Secondary | ICD-10-CM | POA: Diagnosis not present

## 2018-06-27 DIAGNOSIS — L501 Idiopathic urticaria: Secondary | ICD-10-CM | POA: Diagnosis not present

## 2018-06-27 DIAGNOSIS — S56211A Strain of other flexor muscle, fascia and tendon at forearm level, right arm, initial encounter: Secondary | ICD-10-CM | POA: Diagnosis not present

## 2018-06-29 DIAGNOSIS — G4733 Obstructive sleep apnea (adult) (pediatric): Secondary | ICD-10-CM | POA: Diagnosis not present

## 2018-07-03 DIAGNOSIS — X58XXXA Exposure to other specified factors, initial encounter: Secondary | ICD-10-CM | POA: Diagnosis not present

## 2018-07-03 DIAGNOSIS — G8918 Other acute postprocedural pain: Secondary | ICD-10-CM | POA: Diagnosis not present

## 2018-07-03 DIAGNOSIS — S46211A Strain of muscle, fascia and tendon of other parts of biceps, right arm, initial encounter: Secondary | ICD-10-CM | POA: Diagnosis not present

## 2018-07-03 DIAGNOSIS — Y999 Unspecified external cause status: Secondary | ICD-10-CM | POA: Diagnosis not present

## 2018-07-13 DIAGNOSIS — S46211D Strain of muscle, fascia and tendon of other parts of biceps, right arm, subsequent encounter: Secondary | ICD-10-CM | POA: Diagnosis not present

## 2018-07-13 DIAGNOSIS — Z9889 Other specified postprocedural states: Secondary | ICD-10-CM | POA: Diagnosis not present

## 2018-07-25 DIAGNOSIS — L501 Idiopathic urticaria: Secondary | ICD-10-CM | POA: Diagnosis not present

## 2018-07-30 DIAGNOSIS — G4733 Obstructive sleep apnea (adult) (pediatric): Secondary | ICD-10-CM | POA: Diagnosis not present

## 2018-07-30 DIAGNOSIS — Z9889 Other specified postprocedural states: Secondary | ICD-10-CM | POA: Diagnosis not present

## 2018-08-01 DIAGNOSIS — S46211D Strain of muscle, fascia and tendon of other parts of biceps, right arm, subsequent encounter: Secondary | ICD-10-CM | POA: Diagnosis not present

## 2018-08-10 DIAGNOSIS — Z9889 Other specified postprocedural states: Secondary | ICD-10-CM | POA: Diagnosis not present

## 2018-08-12 ENCOUNTER — Encounter: Payer: Self-pay | Admitting: Neurology

## 2018-08-12 DIAGNOSIS — J449 Chronic obstructive pulmonary disease, unspecified: Secondary | ICD-10-CM | POA: Diagnosis not present

## 2018-08-12 DIAGNOSIS — R0902 Hypoxemia: Secondary | ICD-10-CM | POA: Diagnosis not present

## 2018-08-13 DIAGNOSIS — S46211D Strain of muscle, fascia and tendon of other parts of biceps, right arm, subsequent encounter: Secondary | ICD-10-CM | POA: Diagnosis not present

## 2018-08-15 DIAGNOSIS — S46211D Strain of muscle, fascia and tendon of other parts of biceps, right arm, subsequent encounter: Secondary | ICD-10-CM | POA: Diagnosis not present

## 2018-08-20 DIAGNOSIS — S46211D Strain of muscle, fascia and tendon of other parts of biceps, right arm, subsequent encounter: Secondary | ICD-10-CM | POA: Diagnosis not present

## 2018-08-21 ENCOUNTER — Telehealth: Payer: Self-pay

## 2018-08-21 DIAGNOSIS — L501 Idiopathic urticaria: Secondary | ICD-10-CM | POA: Diagnosis not present

## 2018-08-21 NOTE — Telephone Encounter (Signed)
Received ONO on auto pap results from [facility].

## 2018-08-21 NOTE — Telephone Encounter (Signed)
I called pt, left a detailed message on his cell phone number, per DPR, advising him of his ONO on auto pap results. I asked him to call us back with any questions or concerns.

## 2018-08-21 NOTE — Telephone Encounter (Signed)
I reviewed patient's pulse oximetry test from 08/12/2018, patient was on AutoPap therapy at the time, total test time was 7 hours and 49 minutes. Average oxygen saturation was 93%, nadir was 85%, time below or at 88% saturation was 1 minutes and 24 seconds. This indicates that when he is on AutoPap therapy his oxygen saturations are adequate. Please encourage patient to be fully compliant with his AutoPap and follow-up as scheduled in May.

## 2018-08-22 DIAGNOSIS — S46211D Strain of muscle, fascia and tendon of other parts of biceps, right arm, subsequent encounter: Secondary | ICD-10-CM | POA: Diagnosis not present

## 2018-08-27 DIAGNOSIS — Z9889 Other specified postprocedural states: Secondary | ICD-10-CM | POA: Diagnosis not present

## 2018-08-27 DIAGNOSIS — S46211D Strain of muscle, fascia and tendon of other parts of biceps, right arm, subsequent encounter: Secondary | ICD-10-CM | POA: Diagnosis not present

## 2018-08-30 DIAGNOSIS — S46211D Strain of muscle, fascia and tendon of other parts of biceps, right arm, subsequent encounter: Secondary | ICD-10-CM | POA: Diagnosis not present

## 2018-08-30 DIAGNOSIS — G4733 Obstructive sleep apnea (adult) (pediatric): Secondary | ICD-10-CM | POA: Diagnosis not present

## 2018-09-04 DIAGNOSIS — S46211D Strain of muscle, fascia and tendon of other parts of biceps, right arm, subsequent encounter: Secondary | ICD-10-CM | POA: Diagnosis not present

## 2018-09-06 DIAGNOSIS — S46211D Strain of muscle, fascia and tendon of other parts of biceps, right arm, subsequent encounter: Secondary | ICD-10-CM | POA: Diagnosis not present

## 2018-09-11 DIAGNOSIS — S46211D Strain of muscle, fascia and tendon of other parts of biceps, right arm, subsequent encounter: Secondary | ICD-10-CM | POA: Diagnosis not present

## 2018-09-13 DIAGNOSIS — S46211D Strain of muscle, fascia and tendon of other parts of biceps, right arm, subsequent encounter: Secondary | ICD-10-CM | POA: Diagnosis not present

## 2018-09-18 DIAGNOSIS — S46211D Strain of muscle, fascia and tendon of other parts of biceps, right arm, subsequent encounter: Secondary | ICD-10-CM | POA: Diagnosis not present

## 2018-09-18 DIAGNOSIS — L501 Idiopathic urticaria: Secondary | ICD-10-CM | POA: Diagnosis not present

## 2018-09-20 DIAGNOSIS — S46211D Strain of muscle, fascia and tendon of other parts of biceps, right arm, subsequent encounter: Secondary | ICD-10-CM | POA: Diagnosis not present

## 2018-09-28 DIAGNOSIS — G4733 Obstructive sleep apnea (adult) (pediatric): Secondary | ICD-10-CM | POA: Diagnosis not present

## 2018-10-16 DIAGNOSIS — L501 Idiopathic urticaria: Secondary | ICD-10-CM | POA: Diagnosis not present

## 2018-10-28 ENCOUNTER — Other Ambulatory Visit: Payer: Self-pay | Admitting: Family Medicine

## 2018-10-28 DIAGNOSIS — E785 Hyperlipidemia, unspecified: Secondary | ICD-10-CM

## 2018-11-14 ENCOUNTER — Other Ambulatory Visit: Payer: Self-pay | Admitting: Emergency Medicine

## 2018-11-14 DIAGNOSIS — E785 Hyperlipidemia, unspecified: Secondary | ICD-10-CM

## 2018-11-14 DIAGNOSIS — L501 Idiopathic urticaria: Secondary | ICD-10-CM | POA: Diagnosis not present

## 2018-11-14 DIAGNOSIS — Z0000 Encounter for general adult medical examination without abnormal findings: Secondary | ICD-10-CM

## 2018-11-14 DIAGNOSIS — Z125 Encounter for screening for malignant neoplasm of prostate: Secondary | ICD-10-CM

## 2018-11-15 ENCOUNTER — Other Ambulatory Visit: Payer: Self-pay

## 2018-11-15 ENCOUNTER — Ambulatory Visit (INDEPENDENT_AMBULATORY_CARE_PROVIDER_SITE_OTHER): Payer: BLUE CROSS/BLUE SHIELD | Admitting: Family Medicine

## 2018-11-15 DIAGNOSIS — Z125 Encounter for screening for malignant neoplasm of prostate: Secondary | ICD-10-CM | POA: Diagnosis not present

## 2018-11-15 DIAGNOSIS — E785 Hyperlipidemia, unspecified: Secondary | ICD-10-CM

## 2018-11-15 DIAGNOSIS — Z0000 Encounter for general adult medical examination without abnormal findings: Secondary | ICD-10-CM

## 2018-11-16 LAB — COMPREHENSIVE METABOLIC PANEL
ALT: 22 IU/L (ref 0–44)
AST: 18 IU/L (ref 0–40)
Albumin/Globulin Ratio: 1.8 (ref 1.2–2.2)
Albumin: 4.4 g/dL (ref 3.8–4.9)
Alkaline Phosphatase: 96 IU/L (ref 39–117)
BUN/Creatinine Ratio: 11 (ref 9–20)
BUN: 11 mg/dL (ref 6–24)
Bilirubin Total: 0.5 mg/dL (ref 0.0–1.2)
CO2: 23 mmol/L (ref 20–29)
Calcium: 10.1 mg/dL (ref 8.7–10.2)
Chloride: 104 mmol/L (ref 96–106)
Creatinine, Ser: 0.99 mg/dL (ref 0.76–1.27)
GFR calc Af Amer: 100 mL/min/{1.73_m2} (ref >59)
GFR calc non Af Amer: 87 mL/min/{1.73_m2} (ref >59)
Globulin, Total: 2.5 g/dL (ref 1.5–4.5)
Glucose: 98 mg/dL (ref 65–99)
Potassium: 4.5 mmol/L (ref 3.5–5.2)
Sodium: 143 mmol/L (ref 134–144)
Total Protein: 6.9 g/dL (ref 6.0–8.5)

## 2018-11-16 LAB — LIPID PANEL
Chol/HDL Ratio: 3.1 ratio (ref 0.0–5.0)
Cholesterol, Total: 119 mg/dL (ref 100–199)
HDL: 39 mg/dL — ABNORMAL LOW (ref >39)
LDL Calculated: 67 mg/dL (ref 0–99)
Triglycerides: 64 mg/dL (ref 0–149)
VLDL Cholesterol Cal: 13 mg/dL (ref 5–40)

## 2018-11-16 LAB — PSA: Prostate Specific Ag, Serum: 0.6 ng/mL (ref 0.0–4.0)

## 2018-11-20 ENCOUNTER — Telehealth: Payer: Self-pay | Admitting: Neurology

## 2018-11-20 NOTE — Telephone Encounter (Signed)
Pt consented to a Virtual Visit. Email in chart was confirmed with pt.  Pt understands that although there may be some limitations with this type of visit, we will take all precautions to reduce any security or privacy concerns.  Pt understands that this will be treated like an in office visit and we will file with pt's insurance, and there may be a patient responsible charge related to this service.

## 2018-11-21 ENCOUNTER — Other Ambulatory Visit: Payer: Self-pay

## 2018-11-21 ENCOUNTER — Telehealth (INDEPENDENT_AMBULATORY_CARE_PROVIDER_SITE_OTHER): Payer: BLUE CROSS/BLUE SHIELD | Admitting: Family Medicine

## 2018-11-21 ENCOUNTER — Encounter: Payer: Self-pay | Admitting: Family Medicine

## 2018-11-21 DIAGNOSIS — Z0000 Encounter for general adult medical examination without abnormal findings: Secondary | ICD-10-CM

## 2018-11-21 DIAGNOSIS — G4733 Obstructive sleep apnea (adult) (pediatric): Secondary | ICD-10-CM

## 2018-11-21 DIAGNOSIS — E785 Hyperlipidemia, unspecified: Secondary | ICD-10-CM

## 2018-11-21 DIAGNOSIS — Z9989 Dependence on other enabling machines and devices: Secondary | ICD-10-CM

## 2018-11-21 HISTORY — DX: Obstructive sleep apnea (adult) (pediatric): G47.33

## 2018-11-21 MED ORDER — ATORVASTATIN CALCIUM 40 MG PO TABS: ORAL_TABLET | ORAL | 3 refills | Status: DC

## 2018-11-21 NOTE — Progress Notes (Signed)
Virtual Visit via Telephone Note  I connected with Stephen Obrien on 11/21/18 at 9:28 AM by telephone and verified that I am speaking with the correct person using two identifiers.   I discussed the limitations, risks, security and privacy concerns of performing an evaluation and management service by telephone and the availability of in person appointments. I also discussed with the patient that there may be a patient responsible charge related to this service. The patient expressed understanding and agreed to proceed, consent obtained  Chief complaint:  CPE  History of Present Illness: Stephen Obrien is a 53 y.o. male   Has been doing well overall.  Ruptured distal biceps in December, had surgery with Dr. Tamera Punt at [facility], recovering ok and released in March- able to play golf now.   OSA on CPAP: AutoPap therapy with overnight oximetry results from January 26 noted, O2 sat adequate.  Plan for follow-up with Dr. Rexene Alberts in May - virtual visit next week.  Sleeping better, not needing ambien anymore, using melatonin.    Hyperlipidemia: Takes Lipitor 40 mg daily. No new myalgias or side effects. Would like to stay at same dose.  Lab Results  Component Value Date   CHOL 119 11/15/2018   HDL 39 (L) 11/15/2018   LDLCALC 67 11/15/2018   TRIG 64 11/15/2018   CHOLHDL 3.1 11/15/2018   Lab Results  Component Value Date   ALT 22 11/15/2018   AST 18 11/15/2018   ALKPHOS 96 11/15/2018   BILITOT 0.5 11/15/2018    Cancer screening: Colonoscopy 01/21/2016, repeat 10 years. Prostate cancer testing: Lab work performed in the past week, PSA 0.6, stable from readings over the past 2 years.  Immunization History  Administered Date(s) Administered  . Influenza, Seasonal, Injecte, Preservative Fre 06/12/2012  . Influenza,inj,Quad PF,6+ Mos 09/06/2013, 09/09/2014, 09/10/2015  . Tdap 04/24/2007, 11/06/2017  Shingles: had both at [place].   Depression screen  [facility] 2/9 11/21/2018 12/19/2017 11/15/2017 11/06/2017 11/03/2016  Decreased Interest 0 0 0 0 0  Down, Depressed, Hopeless 0 0 0 0 0  PHQ - 2 Score 0 0 0 0 0   Ophthalmology: due for visit - will reschedule once open.  Seen in June of last year with concern of eyelash curling from his eye care provider.  Testing performed including sed rate, TSH, testosterone, C-reactive protein, and ANA that were all normal. Eyelashes look like relaxing back to normal  Dental: every 6 months.   Exercise: He has lost approximately 40 pounds with diet changes and activity/exercise.  Most recent home weight was 203. Using Noom diet approach - more aware of food choices. Has been exercising more recently now that he has bee Walking 4-5 days per week. Yoga, weightlifting. Feeling better.   Wt Readings from Last 3 Encounters:  05/29/18 244 lb (110.7 kg)  01/03/18 242 lb (109.8 kg)  12/19/17 243 lb 6.4 oz (110.4 kg)       Patient Active Problem List   Diagnosis Date Noted  . Insomnia 09/10/2015  . Allergic urticaria 09/06/2013  . Other and unspecified hyperlipidemia 09/06/2013   Past Medical History:  Diagnosis Date  . Allergy   . Anxiety   . Arthritis   . Hyperlipidemia    Past Surgical History:  Procedure Laterality Date  . DISTAL BICEPS TENDON REPAIR  06/2018  . [place]  . VASECTOMY    . WISDOM TOOTH EXTRACTION  1983   No Known Allergies Prior to Admission medications  Medication Sig Start Date End Date Taking? Authorizing Provider  Ascorbic Acid (VITAMIN C) 100 MG tablet Take by mouth.   Yes [provider]  atorvastatin (LIPITOR) 40 MG tablet TAKE 1 TABLET(40 MG) BY MOUTH DAILY AT 6 PM 10/28/18  Yes [name], MD  Cholecalciferol (VITAMIN D3) 10000 units capsule  05/18/17  Yes [provider]  levocetirizine (XYZAL) 5 MG tablet Take 1 tablet (5 mg total) by mouth every evening. 09/10/15  Yes Daub, Loura Back, MD  Melatonin 5 MG CAPS Take by mouth.   Yes  [provider]  Omalizumab [name] Turrell) Inject 150 mg into the skin.    Yes [provider]  SF 1.1 % GEL dental gel USE IN MOUTH TRAYS AFTER BRUSHING WITH TOOTHPASTE 11/16/15  Yes [provider]   Social History   Socioeconomic History  . Marital status: Married    Spouse name: Not on file  . Number of children: Not on file  . Years of education: Not on file  . Highest education level: Not on file  Occupational History  . Not on file  Social Needs  . Financial resource strain: Not on file  . Food insecurity:    Worry: Not on file    Inability: Not on file  . Transportation needs:    Medical: Not on file    Non-medical: Not on file  Tobacco Use  . Smoking status: Never Smoker  . Smokeless tobacco: Never Used  Substance and Sexual Activity  . Alcohol use: Yes    Alcohol/week: 4.0 standard drinks    Types: 4 Standard drinks or equivalent per week    Comment: wine  . Drug use: No  . Sexual activity: Yes  Lifestyle  . Physical activity:    Days per week: Not on file    Minutes per session: Not on file  . Stress: Not on file  Relationships  . Social connections:    Talks on phone: Not on file    Gets together: Not on file    Attends religious service: Not on file    Active member of club or organization: Not on file    Attends meetings of clubs or organizations: Not on file    Relationship status: Not on file  . Intimate partner violence:    Fear of current or ex partner: Not on file    Emotionally abused: Not on file    Physically abused: Not on file    Forced sexual activity: Not on file  Other Topics Concern  . Not on file  Social History Narrative  . Not on file     Observations/Objective: Normal responses, no distress.   Assessment and Plan: Annual physical exam  - -anticipatory guidance as below in AVS, screening labs above. Health maintenance items as above in HPI discussed/recommended as applicable.   OSA on  CPAP  -Tolerating CPAP.  Has follow-up planned with sleep specialist.  Commended on exercise and weight loss with diet changes which should also help obstructive sleep apnea.  Hyperlipidemia, unspecified hyperlipidemia type - Plan: atorvastatin (LIPITOR) 40 MG tablet  -Tolerating Lipitor same dose.  As above commended on diet and exercise with weight loss.  Potentially could try lower dose statin but decided to remain at same dose as he is tolerating so well.  Option of recheck lipids in 6 months to decide if low-dose and at that time or can follow-up in 1 year for physical.  Follow Up Instructions:  1  year for physical - option of 6 month recheck lipids.   Patient Instructions    Good talking to you today.  Congratulations on the weight loss.  As we discussed that is reflected in your improved cholesterol readings.  Can continue same dose of atorvastatin for now but we do have the option of repeat testing in 6 months to see if lower dose could be just as effective.  No changes for now.  Keep follow-up with sleep specialist, but I am glad to hear the CPAP has been beneficial.  Weight loss likely will also help with sleep apnea.   Follow-up in 1 year for physical, but please let me know if there are any questions in the meantime. Keeping you healthy  Get these tests  Blood pressure- Have your blood pressure checked once a year by your healthcare provider.  Normal blood pressure is 120/80  Weight- Have your body mass index (BMI) calculated to screen for obesity.  BMI is a measure of body fat based on height and weight. You can also calculate your own BMI at [email or web address].  Cholesterol- Have your cholesterol checked every year.  Diabetes- Have your blood sugar checked regularly if you have high blood pressure, high cholesterol, have a family history of diabetes or if you are overweight.  Screening for Colon Cancer- Colonoscopy starting at age 88.  Screening may begin sooner  depending on your family history and other health conditions. Follow up colonoscopy as directed by your Gastroenterologist.  Screening for Prostate Cancer- Both blood work (PSA) and a rectal exam help screen for Prostate Cancer.  Screening begins at age 14 with African-American men and at age 26 with Caucasian men.  Screening may begin sooner depending on your family history.  Take these medicines  Aspirin- One aspirin daily can help prevent Heart disease and Stroke.  Flu shot- Every fall.  Tetanus- Every 10 years.  Zostavax- Once after the age of 48 to prevent Shingles.  Pneumonia shot- Once after the age of 63; if you are younger than 60, ask your healthcare provider if you need a Pneumonia shot.  Take these steps  Don't smoke- If you do smoke, talk to your doctor about quitting.  For tips on how to quit, go to www.smokefree.gov or call 1-800-QUIT-NOW.  Be physically active- Exercise 5 days a week for at least 30 minutes.  If you are not already physically active start slow and gradually work up to 30 minutes of moderate physical activity.  Examples of moderate activity include walking briskly, mowing the yard, dancing, swimming, bicycling, etc.  Eat a healthy diet- Eat a variety of healthy food such as fruits, vegetables, low fat milk, low fat cheese, yogurt, lean meant, poultry, fish, beans, tofu, etc. For more information go to www.thenutritionsource.org  Drink alcohol in moderation- Limit alcohol intake to less than two drinks a day. Never drink and drive.  Dentist- Brush and floss twice daily; visit your dentist twice a year.  Depression- Your emotional health is as important as your physical health. If you're feeling down, or losing interest in things you would normally enjoy please talk to your healthcare provider.  Eye exam- Visit your eye doctor every year.  Safe sex- If you may be exposed to a sexually transmitted infection, use a condom.  Seat belts- Seat belts can save  your life; always wear one.  Smoke/Carbon Monoxide detectors- These detectors need to be installed on the appropriate level of your home.  Replace batteries at least once  a year.  Skin cancer- When out in the sun, cover up and use sunscreen 15 SPF or higher.  Violence- If anyone is threatening you, please tell your healthcare provider.  Living Will/ Health care power of attorney- Speak with your healthcare provider and family.   If you have lab work done today you will be contacted with your lab results within the next 2 weeks.  If you have not heard from [place] then please contact us. The fastest way to get your results is to register for My Chart.   IF you received an x-ray today, you will receive an invoice from [facility]. Please contact [facility] at [phone] with questions or concerns regarding your invoice.   IF you received labwork today, you will receive an invoice from [facility]. Please contact [facility] at [phone] with questions or concerns regarding your invoice.   Our billing staff will not be able to assist you with questions regarding bills from these companies.  You will be contacted with the lab results as soon as they are available. The fastest way to get your results is to activate your My Chart account. Instructions are located on the last page of this paperwork. If you have not heard from [place] regarding the results in 2 weeks, please contact this office.          I discussed the assessment and treatment plan with the patient. The patient was provided an opportunity to ask questions and all were answered. The patient agreed with the plan and demonstrated an understanding of the instructions.   The patient was advised to call back or seek an in-person evaluation if the symptoms worsen or if the condition fails to improve as anticipated.  I provided 17 minutes of non-face-to-face time during this encounter.  Signed,   [name],  MD [facility].  11/21/18

## 2018-11-21 NOTE — Telephone Encounter (Signed)
Due to current COVID 19 pandemic, our office is severely reducing in office visits until further notice, in order to minimize the risk to our patients and healthcare providers.   Called patient today and explained the process of the doxy virtual visit. Patient verbalized understanding and is aware of the e-mail containing the link and directions that I have sent him. Patient understands that he will receive a call from RN as well as office staff.  Pt understands that although there may be some limitations with this type of visit, we will take all precautions to reduce any security or privacy concerns.  Pt understands that this will be treated like an in office visit and we will file with pt's insurance, and there may be a patient responsible charge related to this service.

## 2018-11-21 NOTE — Patient Instructions (Addendum)
Good talking to you today.  Congratulations on the weight loss.  As we discussed that is reflected in your improved cholesterol readings.  Can continue same dose of atorvastatin for now but we do have the option of repeat testing in 6 months to see if lower dose could be just as effective.  No changes for now.  Keep follow-up with sleep specialist, but I am glad to hear the CPAP has been beneficial.  Weight loss likely will also help with sleep apnea.   Follow-up in 1 year for physical, but please let me know if there are any questions in the meantime. Keeping you healthy  Get these tests  Blood pressure- Have your blood pressure checked once a year by your healthcare provider.  Normal blood pressure is 120/80  Weight- Have your body mass index (BMI) calculated to screen for obesity.  BMI is a measure of body fat based on height and weight. You can also calculate your own BMI at [email or web address].  Cholesterol- Have your cholesterol checked every year.  Diabetes- Have your blood sugar checked regularly if you have high blood pressure, high cholesterol, have a family history of diabetes or if you are overweight.  Screening for Colon Cancer- Colonoscopy starting at age 33.  Screening may begin sooner depending on your family history and other health conditions. Follow up colonoscopy as directed by your Gastroenterologist.  Screening for Prostate Cancer- Both blood work (PSA) and a rectal exam help screen for Prostate Cancer.  Screening begins at age 73 with African-American men and at age 74 with Caucasian men.  Screening may begin sooner depending on your family history.  Take these medicines  Aspirin- One aspirin daily can help prevent Heart disease and Stroke.  Flu shot- Every fall.  Tetanus- Every 10 years.  Zostavax- Once after the age of 54 to prevent Shingles.  Pneumonia shot- Once after the age of 45; if you are younger than 68, ask your healthcare provider if you need a  Pneumonia shot.  Take these steps  Don't smoke- If you do smoke, talk to your doctor about quitting.  For tips on how to quit, go to www.smokefree.gov or call 1-800-QUIT-NOW.  Be physically active- Exercise 5 days a week for at least 30 minutes.  If you are not already physically active start slow and gradually work up to 30 minutes of moderate physical activity.  Examples of moderate activity include walking briskly, mowing the yard, dancing, swimming, bicycling, etc.  Eat a healthy diet- Eat a variety of healthy food such as fruits, vegetables, low fat milk, low fat cheese, yogurt, lean meant, poultry, fish, beans, tofu, etc. For more information go to www.thenutritionsource.org  Drink alcohol in moderation- Limit alcohol intake to less than two drinks a day. Never drink and drive.  Dentist- Brush and floss twice daily; visit your dentist twice a year.  Depression- Your emotional health is as important as your physical health. If you're feeling down, or losing interest in things you would normally enjoy please talk to your healthcare provider.  Eye exam- Visit your eye doctor every year.  Safe sex- If you may be exposed to a sexually transmitted infection, use a condom.  Seat belts- Seat belts can save your life; always wear one.  Smoke/Carbon Monoxide detectors- These detectors need to be installed on the appropriate level of your home.  Replace batteries at least once a year.  Skin cancer- When out in the sun, cover up and use sunscreen 15 SPF or  higher.  Violence- If anyone is threatening you, please tell your healthcare provider.  Living Will/ Health care power of attorney- Speak with your healthcare provider and family.   If you have lab work done today you will be contacted with your lab results within the next 2 weeks.  If you have not heard from [place] then please contact us. The fastest way to get your results is to register for My Chart.   IF you received an x-ray today, you  will receive an invoice from [facility]. Please contact [facility] at [phone] with questions or concerns regarding your invoice.   IF you received labwork today, you will receive an invoice from [place] [facility]. Please contact [facility] at [phone] with questions or concerns regarding your invoice.   Our billing staff will not be able to assist you with questions regarding bills from these companies.  You will be contacted with the lab results as soon as they are available. The fastest way to get your results is to activate your My Chart account. Instructions are located on the last page of this paperwork. If you have not heard from [place] regarding the results in 2 weeks, please contact this office.

## 2018-11-21 NOTE — Progress Notes (Signed)
CC-CPE- Patient lad labs done here on 11/15/18. Patient stated he is doing well. He is now down 40 lb with Noon diet. Doing well on the c-pap machine.

## 2018-11-26 NOTE — Telephone Encounter (Signed)
I called pt. Pt is ready for his [email or web address] visit tomorrow and has the link saved in his email. Pt's meds, allergies, and PMH were updated.

## 2018-11-27 ENCOUNTER — Ambulatory Visit (INDEPENDENT_AMBULATORY_CARE_PROVIDER_SITE_OTHER): Payer: BLUE CROSS/BLUE SHIELD | Admitting: Neurology

## 2018-11-27 ENCOUNTER — Other Ambulatory Visit: Payer: Self-pay

## 2018-11-27 ENCOUNTER — Encounter: Payer: Self-pay | Admitting: Neurology

## 2018-11-27 DIAGNOSIS — G4733 Obstructive sleep apnea (adult) (pediatric): Secondary | ICD-10-CM | POA: Diagnosis not present

## 2018-11-27 DIAGNOSIS — Z9989 Dependence on other enabling machines and devices: Secondary | ICD-10-CM

## 2018-11-27 NOTE — Progress Notes (Signed)
Interim history:  [name] is a 53 year old right-handed gentleman with an underlying medical history of allergic rhinitis, arthritis, anxiety, hyperlipidemia, recurrent hives for which he is on Xolair monthly injections, and obesity, who presents for a virtual, video based visit via [email or web address] for follow-up consultation of his obstructive sleep apnea, on autoPap therapy. The patient is unaccompanied today and joins via computer from his work/office, I am located in my office.  I last saw him on 05/29/2018, at which time we talked about his home sleep test result.  He was advised to continue with AutoPap.  He was compliant with it.  He was also advised to proceed with an overnight pulse oximetry test to monitor oxygen saturation at night while on treatment as his home sleep test indicated an O2 nadir of 79%.  He was also advised to try melatonin in lieu of Ambien.  He had an overnight pulse oximetry test in the interim, I reviewed the results from 08/12/2018: Average oxygen saturation was 93%, nadir was 85%, time below 89% saturation was less than 2 minutes.  We called him with his test results.  Today, 11/27/2018: Please also see below for virtual visit documentation.  I reviewed his AutoPap compliance data from 10/27/2018 through 11/25/2018 which is a total of 30 days, during which time he used his AutoPap 16 days, percent used days greater than 4 hours at 53%, indicating suboptimal compliance with an average usage of 7 hours and 25 minutes, which is very good, residual AHI at goal at 0.6/h, leak on the higher end with a 95th percentile at 24.1 L/min on a pressure of 9.7 for average pressure, range of 7 cm to 15 cm. In the past 90 days his compliance for more than 4 hours was slightly better at 62%.   The patient's allergies, current medications, family history, past medical history, past social history, past surgical history and problem list were reviewed and updated as appropriate.    Previously (copied  from previous notes for reference):   I first met him on 01/03/2018 at the request of his primary care physician, at which time the patient reported snoring and sleep difficulty including not waking up rested and morning headaches. He was advised to proceed with sleep study testing. He had a home sleep test as insurance denied a laboratory attended sleep study. Home sleep test from 02/18/2018 showed moderate obstructive sleep apnea with an AHI of 21.1 per hour, average oxygen saturation of 92%, nadir of 79%. Time below or at 88% saturation was 60 minutes. He was advised to proceed with AutoPap therapy as insurance denied a laboratory attended sleep study.   I reviewed his AutoPap compliance data from 04/28/2018 through 05/27/2018 which is a total of 30 days, during which time he used his AutoPap 24 days with percent used days greater than 4 hours at 73%, indicating adequate compliance with an average usage of 7 hours and 24 minutes for days on treatment, residual AHI at goal at 1.7 per hour, 95th percentile pressure right dat 11 cm, leak acceptable for the 95th percentile at 13.6 L/m on a pressure range of 7 cm to 15 cm.    01/03/2018: (He) reports occasional snoring and sleep disturbance including nonrestorative sleep, occasional morning headaches, and difficulty maintaining sleep. I reviewed your office note from 11/06/2017. His Epworth sleepiness score is 5 out of 24 today, fatigue score is 15 out of 63. He is married and lives with his wife and 2 children. He is a nonsmoker  and drinks alcohol in the form of wine, 3-4 glasses per week on average, he does not typically utilize caffeine on a day-to-day basis. He does not have a family history of sleep apnea. He denies restless leg symptoms or leg twitching at night, has rare sleep talking and had some sleepwalking as a child. He has no significant nocturia, certainly not night to night. He has had occasional morning headaches and reports a fairly strong  family history of migraine headaches affecting his mother and sisters. His bedtime is around 11 PM and while he does not have difficulty falling asleep generally speaking he does have difficulty maintaining sleep and reports multiple nighttime awakenings, not typically very long at the time. He has been on Ambien generic for some time, used only take it once or twice per week, currently takes it most nights of the week. His snoring is more pronounced when he sleeps on his back, he tries to sleep on his sides. They have 1 dog in the household, in their bedroom but not on the bed. He has a 22 year old daughter and 97 year old son. He has his own business.  His Past Medical History Is Significant For: Past Medical History:  Diagnosis Date   Allergy    Anxiety    Arthritis    Hyperlipidemia    OSA on CPAP 11/21/2018    His Past Surgical History Is Significant For: Past Surgical History:  Procedure Laterality Date   DISTAL BICEPS TENDON REPAIR  06/2018   TEAR DUCT PROBING  1968   VASECTOMY     WISDOM TOOTH EXTRACTION  1983    His Family History Is Significant For: Family History  Problem Relation Age of Onset   Stroke Father    Depression Sister    Alcohol abuse Sister    Hyperlipidemia Mother    Cancer Maternal Grandmother        leukemia   Cancer Maternal Grandfather        lung ca   Cancer Paternal Grandfather        mesothelioma   Colon cancer Neg Hx     His Social History Is Significant For: Social History   Socioeconomic History   Marital status: Married    Spouse name: Not on file   Number of children: Not on file   Years of education: Not on file   Highest education level: Not on file  Occupational History   Not on file  Social Needs   Financial resource strain: Not on file   Food insecurity:    Worry: Not on file    Inability: Not on file   Transportation needs:    Medical: Not on file    Non-medical: Not on file  Tobacco Use    Smoking status: Never Smoker   Smokeless tobacco: Never Used  Substance and Sexual Activity   Alcohol use: Yes    Alcohol/week: 4.0 standard drinks    Types: 4 Standard drinks or equivalent per week    Comment: wine   Drug use: No   Sexual activity: Yes  Lifestyle   Physical activity:    Days per week: Not on file    Minutes per session: Not on file   Stress: Not on file  Relationships   Social connections:    Talks on phone: Not on file    Gets together: Not on file    Attends religious service: Not on file    Active member of club or organization: Not on file  Attends meetings of clubs or organizations: Not on file    Relationship status: Not on file  Other Topics Concern   Not on file  Social History Narrative   Not on file    His Allergies Are:  No Known Allergies:   His Current Medications Are:  Outpatient Encounter Medications as of 11/27/2018  Medication Sig   Ascorbic Acid (VITAMIN C) 100 MG tablet Take by mouth.   atorvastatin (LIPITOR) 40 MG tablet TAKE 1 TABLET(40 MG) BY MOUTH DAILY AT 6 PM   Cholecalciferol (VITAMIN D3) 10000 units capsule    levocetirizine (XYZAL) 5 MG tablet Take 1 tablet (5 mg total) by mouth every evening.   Melatonin 5 MG CAPS Take by mouth.   Omalizumab (XOLAIR [place]) Inject 150 mg into the skin.    SF 1.1 % GEL dental gel USE IN MOUTH TRAYS AFTER BRUSHING WITH TOOTHPASTE   No facility-administered encounter medications on file as of 11/27/2018.   :  Review of Systems:  Out of a complete 14 point review of systems, all are reviewed and negative with the exception of these symptoms as listed below:  Virtual Visit via Video Note on @ TODAY@  I connected with@ on 11/27/18 at 11:30 AM EDT by a video enabled telemedicine application and verified that I am speaking with the correct person using two identifiers.   I discussed the limitations of evaluation and management by telemedicine and the availability of in person  appointments. The patient expressed understanding and agreed to proceed.  History of Present Illness: He reports Doing well with his AutoPap, he sleeps well.  He has had some lapses in treatment because of travel and not taking the machine with him.  He has been losing weight, he works on his weight loss with the help of [employer]. He is using a CPAP cleaning machine which he finds quite useful.  He has been able to sleep better, no longer uses Ambien, has been utilizing melatonin typically 5 mg strength.  He is doing well otherwise, no acute illness thankfully.  Continues to work out of his office.He has had some difficulty obtaining supplies through his DME company, was not able to get in touch with them, was redirected frequently but feels like the issues have been resolved at this moment.    Observations/Objective:  There are no recent vital signs available for my review in his chart.  On examination, he is pleasant, conversant, in no acute distress, good comprehension and language skills, face is symmetric with normal facial animation, speech is clear with no dysarthria, no hypophonia and no voice tremor noted.  Extraocular movements are well preserved in all directions.  Upper body muscle bulk appears normal, upper body coordination is unremarkable.   Assessment and Plan: In summary,Burk Eakesis a very pleasant 20 year oldmalewith an underlying medical history of allergic rhinitis, arthritis, anxiety, hyperlipidemia, recurrent hives for which he is on Xolair monthly injections,and obesity, who presents for follow-up consultation of his obstructive sleep apnea which was deemed to be in the moderate range by home sleep testing in August 2019. He has established treatment with AutoPap therapy at home and Has been compliant with it, particularly some 6 months ago he was more consistent with treatment, he has had some lapses in treatment secondary to travel and giving the treatment a break at times,  he has had trouble getting his supplies on a timely manner, has not had a change in his headgear since the original over nearly 9 months  ago.  He is due for supplies and has it straightened out now.  Part of the air leaking may be from overusing supplies.  He is up-to-date with the filters and also has bought a CPAP gear cleaning machine.  He is pleased with it.  He is indicating ongoing benefit from treatment, he is highly commended for his weight loss, he has lost over 40 pounds in the past 6 months. Recent pulse oximetry test results were benign in January 2020.  He is encouraged to be fully compliant with his AutoPap machine.  He is advised to try to maintain his weight or lose a little more if he can, he is furthermore encouraged to consider retesting with a home sleep test down the road, perhaps even later this year.  For now, he will continue with AutoPap therapy with the current settings and get in touch with [place] for any interim questions or concerns or if he needs help with facilitating supplies through his DME company.  He can follow-up routinely in 1 year, unless we do retest him later this year, he will be in touch by phone if need be.  I answered all his questions today and he was in agreement.   Follow Up Instructions: Continue AutoPap therapy, follow-up in a year routinely with nurse practitioner, continue to work on weight management.    I discussed the assessment and treatment plan with the patient. The patient was provided an opportunity to ask questions and all were answered. The patient agreed with the plan and demonstrated an understanding of the instructions.   The patient was advised to call back or seek an in-person evaluation if the symptoms worsen or if the condition fails to improve as anticipated.  I provided 20 minutes of non-face-to-face time during this encounter.   Star Age, MD

## 2018-11-27 NOTE — Patient Instructions (Signed)
Given verbally, during today's virtual video-based encounter, with verbal feedback received.   

## 2018-11-28 ENCOUNTER — Telehealth: Payer: Self-pay

## 2018-11-28 DIAGNOSIS — G4733 Obstructive sleep apnea (adult) (pediatric): Secondary | ICD-10-CM | POA: Diagnosis not present

## 2018-11-28 NOTE — Telephone Encounter (Signed)
Dr. Rexene Alberts ordered a 1 year follow up with NP for pt. I called pt to schedule this, no answer, left a message asking him to call us back. If pt calls back, please assist him with this.

## 2018-12-12 DIAGNOSIS — L509 Urticaria, unspecified: Secondary | ICD-10-CM | POA: Diagnosis not present

## 2018-12-12 DIAGNOSIS — J301 Allergic rhinitis due to pollen: Secondary | ICD-10-CM | POA: Diagnosis not present

## 2018-12-12 DIAGNOSIS — L501 Idiopathic urticaria: Secondary | ICD-10-CM | POA: Diagnosis not present

## 2018-12-12 DIAGNOSIS — J3089 Other allergic rhinitis: Secondary | ICD-10-CM | POA: Diagnosis not present

## 2018-12-29 DIAGNOSIS — G4733 Obstructive sleep apnea (adult) (pediatric): Secondary | ICD-10-CM | POA: Diagnosis not present

## 2019-01-03 DIAGNOSIS — H5213 Myopia, bilateral: Secondary | ICD-10-CM | POA: Diagnosis not present

## 2019-01-22 DIAGNOSIS — L501 Idiopathic urticaria: Secondary | ICD-10-CM | POA: Diagnosis not present

## 2019-01-28 DIAGNOSIS — G4733 Obstructive sleep apnea (adult) (pediatric): Secondary | ICD-10-CM | POA: Diagnosis not present

## 2019-02-19 DIAGNOSIS — L501 Idiopathic urticaria: Secondary | ICD-10-CM | POA: Diagnosis not present

## 2019-03-19 DIAGNOSIS — L501 Idiopathic urticaria: Secondary | ICD-10-CM | POA: Diagnosis not present

## 2019-04-01 ENCOUNTER — Other Ambulatory Visit: Payer: Self-pay

## 2019-04-01 ENCOUNTER — Encounter: Payer: Self-pay | Admitting: Family Medicine

## 2019-04-01 ENCOUNTER — Ambulatory Visit (INDEPENDENT_AMBULATORY_CARE_PROVIDER_SITE_OTHER): Payer: BC Managed Care – PPO | Admitting: Family Medicine

## 2019-04-01 VITALS — BP 106/68 | HR 71 | Temp 98.0°F | Resp 12 | Wt 192.0 lb

## 2019-04-01 DIAGNOSIS — M79604 Pain in right leg: Secondary | ICD-10-CM

## 2019-04-01 DIAGNOSIS — Z23 Encounter for immunization: Secondary | ICD-10-CM

## 2019-04-01 DIAGNOSIS — M79605 Pain in left leg: Secondary | ICD-10-CM

## 2019-04-01 DIAGNOSIS — R109 Unspecified abdominal pain: Secondary | ICD-10-CM | POA: Diagnosis not present

## 2019-04-01 NOTE — Progress Notes (Signed)
Subjective:    Patient ID: Stephen Obrien, male    DOB: 1966/05/25, 53 y.o.   MRN: [id number]  HPI Stephen Obrien is a 53 y.o. male Presents today for: Chief Complaint  Patient presents with  . Groin Pain    Groin tenderness in the lower left groin area for 10 days but doing much better now but wanted to get checked out  . Leg Pain    leg pain back of both legs for 6 week not sure this is related to the other but wanted to mention it.   Left Groin pain.  Noticed doing dishes, up against counter 12 days ago, leaned in. No swelling/bump.  Some improvement - pressure with moving leg at times. Felt like groin strain. Min discomfort now. No difficulty  Pain in hamstrings past 6 weeks , working with massage therapist, stretches. Same pain.  Buttock area down to calves on occasion. Usually in thighs.    Patient Active Problem List   Diagnosis Date Noted  . OSA on CPAP 11/21/2018  . Insomnia 09/10/2015  . Allergic urticaria 09/06/2013  . Other and unspecified hyperlipidemia 09/06/2013   Past Medical History:  Diagnosis Date  . Allergy   . Anxiety   . Arthritis   . Hyperlipidemia   . OSA on CPAP 11/21/2018   Past Surgical History:  Procedure Laterality Date  . DISTAL BICEPS TENDON REPAIR  06/2018  . [place]  . VASECTOMY    . WISDOM TOOTH EXTRACTION  1983   No Known Allergies Prior to Admission medications   Medication Sig Start Date End Date Taking? Authorizing Provider  Ascorbic Acid (VITAMIN C) 100 MG tablet Take by mouth.    [provider]  atorvastatin (LIPITOR) 40 MG tablet TAKE 1 TABLET(40 MG) BY MOUTH DAILY AT 6 PM 11/21/18   [name], MD  Cholecalciferol (VITAMIN D3) 10000 units capsule  05/18/17   [provider]  levocetirizine (XYZAL) 5 MG tablet Take 1 tablet (5 mg total) by mouth every evening. 09/10/15   [name], MD  Melatonin 5 MG CAPS Take by mouth.    [provider]  Omalizumab [name] [place]) Inject 150 mg  into the skin.     [provider]  SF 1.1 % GEL dental gel USE IN MOUTH TRAYS AFTER BRUSHING WITH TOOTHPASTE 11/16/15   [provider]   Social History   Socioeconomic History  . Marital status: Married    Spouse name: Not on file  . Number of children: Not on file  . Years of education: Not on file  . Highest education level: Not on file  Occupational History  . Not on file  Social Needs  . Financial resource strain: Not on file  . Food insecurity    Worry: Not on file    Inability: Not on file  . Transportation needs    Medical: Not on file    Non-medical: Not on file  Tobacco Use  . Smoking status: Never Smoker  . Smokeless tobacco: Never Used  Substance and Sexual Activity  . Alcohol use: Yes    Alcohol/week: 4.0 standard drinks    Types: 4 Standard drinks or equivalent per week    Comment: wine  . Drug use: No  . Sexual activity: Yes  Lifestyle  . Physical activity    Days per week: Not on file    Minutes per session: Not on file  . Stress: Not on file  Relationships  . Social Herbalist on phone: Not on file    Gets together: Not on file    Attends religious service: Not on file    Active member of club or organization: Not on file    Attends meetings of clubs or organizations: Not on file    Relationship status: Not on file  . Intimate partner violence    Fear of current or ex partner: Not on file    Emotionally abused: Not on file    Physically abused: Not on file    Forced sexual activity: Not on file  Other Topics Concern  . Not on file  Social History Narrative  . Not on file    Review of Systems Per HPI.     Objective:   Physical Exam Constitutional:      General: He is not in acute distress.    Appearance: He is well-developed.  HENT:     Head: Normocephalic and atraumatic.  Cardiovascular:     Rate and Rhythm: Normal rate.  Pulmonary:     Effort: Pulmonary effort is normal.  Abdominal:     Tenderness:  There is no abdominal tenderness.     Hernia: There is no hernia in the umbilical area, ventral area, left inguinal area or right inguinal area.    Neurological:     Mental Status: He is alert and oriented to person, place, and time.    Vitals:   04/01/19 1705  BP: 106/68  Pulse: 71  Resp: 12  Temp: 98 F (36.7 C)  TempSrc: Oral  SpO2: 97%  Weight: 192 lb (87.1 kg)      Assessment & Plan:  Stephen Obrien is a 53 y.o. male Abdominal wall pain  - no hernia appreciated, with improving symptoms.  Option of CT versus ultrasound to evaluate further, but will watch for improvement in next 2 weeks.  RTC precautions/hernia precautions.  Need for prophylactic vaccination and inoculation against influenza - Plan: Flu Vaccine QUAD 6+ mos PF IM (Fluarix Quad PF)  Pain in both lower extremities Thought to be hamstring issue, but with pain moving up towards buttock/back, differential includes sciatica.  Plans to discuss further with potential imaging at follow-up visit in next few weeks.  RTC precautions if worsening sooner.    No orders of the defined types were placed in this encounter.  Patient Instructions    Pain at abdominal wall does not appear to be hernia at this time but if it does not improve in the next 2 weeks or worsening sooner can check with ultrasound or CAT scan.  Okay to continue hamstring stretches but follow-up to discuss that as well in the next few weeks if still persistent as lumbar spine issues can also cause radiating pain down the legs.  Return to the clinic or go to the nearest emergency room if any of your symptoms worsen or new symptoms occur.   If you have lab work done today you will be contacted with your lab results within the next 2 weeks.  If you have not heard from [place] then please contact us. The fastest way to get your results is to register for My Chart.   IF you received an x-ray today, you will receive an invoice from [facility]. Please  contact [facility] at [phone] with questions or concerns regarding your invoice.   IF you received labwork today, you will receive an invoice from [facility]. Please contact [facility] at [phone] with  questions or concerns regarding your invoice.   Our billing staff will not be able to assist you with questions regarding bills from these companies.  You will be contacted with the lab results as soon as they are available. The fastest way to get your results is to activate your My Chart account. Instructions are located on the last page of this paperwork. If you have not heard from [place] regarding the results in 2 weeks, please contact this office.       Signed,   [name], MD [facility].  04/01/19 8:21 PM

## 2019-04-01 NOTE — Patient Instructions (Addendum)
  Pain at abdominal wall does not appear to be hernia at this time but if it does not improve in the next 2 weeks or worsening sooner can check with ultrasound or CAT scan.  Okay to continue hamstring stretches but follow-up to discuss that as well in the next few weeks if still persistent as lumbar spine issues can also cause radiating pain down the legs.  Return to the clinic or go to the nearest emergency room if any of your symptoms worsen or new symptoms occur.   If you have lab work done today you will be contacted with your lab results within the next 2 weeks.  If you have not heard from [place] then please contact us. The fastest way to get your results is to register for My Chart.   IF you received an x-ray today, you will receive an invoice from [facility]. Please contact [facility] at [phone] with questions or concerns regarding your invoice.   IF you received labwork today, you will receive an invoice from [facility]. Please contact [facility] at [phone] with questions or concerns regarding your invoice.   Our billing staff will not be able to assist you with questions regarding bills from these companies.  You will be contacted with the lab results as soon as they are available. The fastest way to get your results is to activate your My Chart account. Instructions are located on the last page of this paperwork. If you have not heard from [place] regarding the results in 2 weeks, please contact this office.

## 2019-04-02 ENCOUNTER — Encounter: Payer: Self-pay | Admitting: Family Medicine

## 2019-04-15 ENCOUNTER — Ambulatory Visit: Payer: BC Managed Care – PPO | Admitting: Family Medicine

## 2019-04-16 DIAGNOSIS — L501 Idiopathic urticaria: Secondary | ICD-10-CM | POA: Diagnosis not present

## 2019-04-24 ENCOUNTER — Ambulatory Visit: Payer: BC Managed Care – PPO | Admitting: Family Medicine

## 2019-04-25 DIAGNOSIS — M25552 Pain in left hip: Secondary | ICD-10-CM | POA: Diagnosis not present

## 2019-05-02 DIAGNOSIS — M25552 Pain in left hip: Secondary | ICD-10-CM | POA: Diagnosis not present

## 2019-05-14 DIAGNOSIS — L501 Idiopathic urticaria: Secondary | ICD-10-CM | POA: Diagnosis not present

## 2019-05-28 ENCOUNTER — Telehealth: Payer: Self-pay

## 2019-05-28 DIAGNOSIS — R634 Abnormal weight loss: Secondary | ICD-10-CM

## 2019-05-28 DIAGNOSIS — G4733 Obstructive sleep apnea (adult) (pediatric): Secondary | ICD-10-CM

## 2019-05-28 NOTE — Telephone Encounter (Signed)
Pt has called and left me a message stating that he has lost 60lbs. He is sleeping well and previous OSA symptoms have improved. Pt is currently on CPAP and is wondering if he needs it anymore. Pt has requested to do a repeat HST to check status of OSA. Please give patient a call with what his next step will [id number] visit with Dr. Rexene Alberts or immediate order for HST. Thanks

## 2019-05-28 NOTE — Telephone Encounter (Signed)
Pt's last visit was in May of 2020. Would you like a OV or just order an HST?

## 2019-05-28 NOTE — Telephone Encounter (Signed)
Please advise patient that I have ordered a home sleep test for reevaluation of his obstructive sleep apnea.  Since he lost a significant amount of weight, I suggested we go ahead and proceed with a home sleep test and we can call him with the results and follow-up afterwards if needed.

## 2019-05-28 NOTE — Addendum Note (Signed)
Addended by: Star Age on: 05/28/2019 04:53 PM   Modules accepted: Orders

## 2019-05-28 NOTE — Telephone Encounter (Signed)
I called pt to discuss. No answer, left a message asking him to call me back. 

## 2019-05-29 NOTE — Telephone Encounter (Signed)
Pt returned my call. I advised him of Dr. Guadelupe Sabin recommendations. He will wait for the sleep lab to call him. Pt verbalized understanding of recommendations. Pt had no questions at this time but was encouraged to call back if questions arise.

## 2019-05-29 NOTE — Telephone Encounter (Signed)
Pt returning call please call back °

## 2019-06-10 ENCOUNTER — Ambulatory Visit (INDEPENDENT_AMBULATORY_CARE_PROVIDER_SITE_OTHER): Payer: BC Managed Care – PPO | Admitting: Neurology

## 2019-06-10 DIAGNOSIS — R634 Abnormal weight loss: Secondary | ICD-10-CM

## 2019-06-10 DIAGNOSIS — G4733 Obstructive sleep apnea (adult) (pediatric): Secondary | ICD-10-CM

## 2019-06-11 DIAGNOSIS — L501 Idiopathic urticaria: Secondary | ICD-10-CM | POA: Diagnosis not present

## 2019-06-12 ENCOUNTER — Telehealth: Payer: Self-pay

## 2019-06-12 NOTE — Procedures (Signed)
  Patient Information     First Name: Stephen Last Name: Obrien ID: [id number]  Birth Date: 1965/09/30 Age: 53 Gender: Male  Referring Provider: [name], MD BMI: 26.0 (W=192 lb, H=6' 0'')    Epworth:  5/24   Sleep Study Information    Study Date: Jun 10, 2019 S/H/A Version: 001.001.001.001 / 4.1.1528 / 44    History:    53 year old man with a history of allergic rhinitis, arthritis, anxiety, hyperlipidemia, recurrent hives, and overweight state, who has been on autoPAP therapy for his OSA. He presents for re-evaluation of his OSA, after achieving significant weight loss and improvement of sleep-related symptoms reported.   Summary & Diagnosis:    Mild OSA  Recommendations:     This study demonstrates overall mild residual obstructive sleep apnea, much improved from his HST from 02/18/18 (AHI 21.1/hour, O2 nadir of 79%). Treatment with autoPAP is optional; a pressure reduction may help and will be offered to the patient. Ongoing weight management may further improve his OSA. He can also discontinue autoPAP at this time, if he prefers.  The patient should be cautioned not to drive, work at heights, or operate dangerous or heavy equipment when tired or sleepy. Review and reiteration of good sleep hygiene measures should be pursued with any patient. The patient will be notified of the test results.  I certify that I have reviewed the raw data recording prior to the issuance of this report in accordance with the standards of the American Academy of Sleep Medicine (AASM).  Star Age, MD, PhD [facility]) Diplomat, ABPN (Neurology and Sleep)                             Start Study Time: End Study Time: Total Recording Time:     10:58:01 PM 7:11:08 AM       8 h, 13 min  Total Sleep Time % REM of Sleep Time:  7 h, 16 min  30.7    Mean: 97 Minimum: 91 Maximum: 100  Mean of Desaturations Nadirs (%):   94  Oxygen Desaturation %: 4-9 10-20  >20 Total  Events Number Total  15 100.0  0 0.0  0 0.0  15 100.0  Oxygen Saturation: <90 <=88 <85 <80 <70  Duration (minutes): Sleep % 0.0 0.0 0.0 0.0 0.0 0.0 0.0 0.0 0.0 0.0     Respiratory Indices       Total Events REM NREM All Night  pRDI: pAHI: ODI:  87  53  15 11.7 7.9 0.9 12.4 7.2 2.6 12.2 7.4 2.1       Pulse Rate Statistics during Sleep (BPM)      Mean: 67 Minimum: 37 Maximum: 101    Oxygen Saturation Statistics   Sleep Summary   Indices are calculated using technically valid sleep time of  7 hrs, 8 min. pRDI/pAHI are calculated using oxi desaturations ? 3%                   Sleep Stages Chart

## 2019-06-12 NOTE — Telephone Encounter (Signed)
-----   Message from Star Age, MD sent at 06/12/2019 12:41 PM EST ----- Patient has been on autoPAP. Has lost a significant amount of weight and had HST on 06/10/19 for re-eval. His HST shows mild residual obstructive sleep apnea (AHI 7.4/h, O2 nadir of 91%), much improved from his HST from 02/18/18 (AHI 21.1/hour, O2 nadir of 79%). Treatment with autoPAP is optional; a pressure reduction may help, if he would like or went can D/C autoPAP. Please let me know, how he would like to proceed and we can send an order to DME for pressure setting reduction or D/C, if he prefers.   Star Age, MD, PhD [facility])

## 2019-06-12 NOTE — Progress Notes (Signed)
Patient has been on autoPAP. Has lost a significant amount of weight and had HST on 06/10/19 for re-eval. His HST shows mild residual obstructive sleep apnea (AHI 7.4/h, O2 nadir of 91%), much improved from his HST from 02/18/18 (AHI 21.1/hour, O2 nadir of 79%). Treatment with autoPAP is optional; a pressure reduction may help, if he would like or went can D/C autoPAP. Please let me know, how he would like to proceed and we can send an order to DME for pressure setting reduction or D/C, if he prefers.   Star Age, MD, PhD [facility])

## 2019-06-12 NOTE — Telephone Encounter (Signed)
I called pt and discussed his sleep study results and recommendations with him. Pt wants to think about how he wants to proceed over the weekend. He will let us know on Monday. Pt verbalized understanding of results. Pt had no questions at this time but was encouraged to call back if questions arise.

## 2019-06-17 ENCOUNTER — Other Ambulatory Visit: Payer: Self-pay

## 2019-06-17 ENCOUNTER — Encounter: Payer: Self-pay | Admitting: Family Medicine

## 2019-06-17 ENCOUNTER — Ambulatory Visit (INDEPENDENT_AMBULATORY_CARE_PROVIDER_SITE_OTHER): Payer: BC Managed Care – PPO

## 2019-06-17 ENCOUNTER — Ambulatory Visit (INDEPENDENT_AMBULATORY_CARE_PROVIDER_SITE_OTHER): Payer: BC Managed Care – PPO | Admitting: Family Medicine

## 2019-06-17 VITALS — BP 119/73 | HR 74 | Temp 97.9°F | Wt 194.6 lb

## 2019-06-17 DIAGNOSIS — M5442 Lumbago with sciatica, left side: Secondary | ICD-10-CM

## 2019-06-17 DIAGNOSIS — M25552 Pain in left hip: Secondary | ICD-10-CM | POA: Diagnosis not present

## 2019-06-17 DIAGNOSIS — M545 Low back pain: Secondary | ICD-10-CM | POA: Diagnosis not present

## 2019-06-17 DIAGNOSIS — M5441 Lumbago with sciatica, right side: Secondary | ICD-10-CM

## 2019-06-17 MED ORDER — PREDNISONE 20 MG PO TABS: ORAL_TABLET | ORAL | 0 refills | Status: DC

## 2019-06-17 NOTE — Patient Instructions (Addendum)
  I will check an x-ray and let you know if there are any concerns as well as the inflammation tests. For now okay to continue Aleve up to twice per day as needed for pain, range of motion stretches, heat to affected area or ice if that feels better. If worsening pain, sciatica symptoms, can start prednisone again -printed prescription. I will refer you to physical therapy.  Follow-up in 4 weeks, which can be a telemedicine visit.Marland Kitchen Return to the clinic or go to the nearest emergency room if any of your symptoms worsen or new symptoms occur.    If you have lab work done today you will be contacted with your lab results within the next 2 weeks.  If you have not heard from [place] then please contact us. The fastest way to get your results is to register for My Chart.   IF you received an x-ray today, you will receive an invoice from [facility]. Please contact [facility] at [phone] with questions or concerns regarding your invoice.   IF you received labwork today, you will receive an invoice from [facility]. Please contact [facility] at [phone] with questions or concerns regarding your invoice.   Our billing staff will not be able to assist you with questions regarding bills from these companies.  You will be contacted with the lab results as soon as they are available. The fastest way to get your results is to activate your My Chart account. Instructions are located on the last page of this paperwork. If you have not heard from [place] regarding the results in 2 weeks, please contact this office.

## 2019-06-17 NOTE — Progress Notes (Signed)
Subjective:  Patient ID: Stephen Obrien, male    DOB: 1966/01/01  Age: 53 y.o. MRN: [id number]  CC:  Chief Complaint  Patient presents with  . Leg Pain    pelvic pain, both leg pain, left hip pain. Have been seen by ortho was given prednisone which cleared it up but now flaring back up.    HPI Stephen Obrien presents for   Bilateral leg pain, hip pain on left,, pelvic pain. Abdominal wall pain discussed September 14 of this visit, was improving at that time no hernia appreciated.  Plan for return recheck if not continuing to improve.  He also was having some pain in both legs, thought to be possible hamstring issue but pain moving towards buttocks/back with differential including sciatica.  I discussed visit in a few weeks to evaluate further.  L hip pain started few weeks later - had to use cane. Saw Dr. Rip Harbour at [place] - "fluid on left hip" - XR then [facility] [place].  [facility].  Fluid greatly reduced but not resolved a few weeks later, with prednisone and Alleve. Pain had resolved.   Pain returned 8 days ago - sensation in L hip that worsened during golf, even with riding (usually walks). Worse, but able to WB.    Low back/upper buttock area pain past 2 weeks or so with pain radiating down both legs - back of both legs to knee.  Better at times with flexing hip to stretch. (demonstrated piriformis stretch).   Has had popping in hips with certain movements for some time.   No bowel or bladder incontinence, no saddle anesthesia, no lower extremity weakness.  No rash No night sweats, or unexplained wt loss.   TX: Alleve - BID. Some relief.  Better today than some days - fleeting today.   No neck pain No change in time of day.    History Patient Active Problem List   Diagnosis Date Noted  . OSA on CPAP 11/21/2018  . Insomnia 09/10/2015  . Allergic urticaria 09/06/2013  . Other and unspecified hyperlipidemia 09/06/2013   Past Medical History:  Diagnosis Date  . Allergy   .  Anxiety   . Arthritis   . Hyperlipidemia   . OSA on CPAP 11/21/2018   Past Surgical History:  Procedure Laterality Date  . DISTAL BICEPS TENDON REPAIR  06/2018  . [place]  . VASECTOMY    . WISDOM TOOTH EXTRACTION  1983   No Known Allergies Prior to Admission medications   Medication Sig Start Date End Date Taking? Authorizing Provider  Ascorbic Acid (VITAMIN C) 100 MG tablet Take by mouth.   Yes [provider]  atorvastatin (LIPITOR) 40 MG tablet TAKE 1 TABLET(40 MG) BY MOUTH DAILY AT 6 PM 11/21/18  Yes [name], MD  Cholecalciferol (VITAMIN D3) 10000 units capsule  05/18/17  Yes [provider]  levocetirizine (XYZAL) 5 MG tablet Take 1 tablet (5 mg total) by mouth every evening. 09/10/15  Yes Daub, Loura Back, MD  Melatonin 5 MG CAPS Take by mouth.   Yes [provider]  Omalizumab [name] ) Inject 150 mg into the skin.    Yes [provider]  SF 1.1 % GEL dental gel USE IN MOUTH TRAYS AFTER BRUSHING WITH TOOTHPASTE 11/16/15  Yes [provider]   Social History   Socioeconomic History  . Marital status: Married    Spouse name: Not on file  . Number of children: Not on file  .  Years of education: Not on file  . Highest education level: Not on file  Occupational History  . Not on file  Social Needs  . Financial resource strain: Not on file  . Food insecurity    Worry: Not on file    Inability: Not on file  . Transportation needs    Medical: Not on file    Non-medical: Not on file  Tobacco Use  . Smoking status: Never Smoker  . Smokeless tobacco: Never Used  Substance and Sexual Activity  . Alcohol use: Yes    Alcohol/week: 4.0 standard drinks    Types: 4 Standard drinks or equivalent per week    Comment: wine  . Drug use: No  . Sexual activity: Yes  Lifestyle  . Physical activity    Days per week: Not on file    Minutes per session: Not on file  . Stress: Not on file  Relationships  . Social  Herbalist on phone: Not on file    Gets together: Not on file    Attends religious service: Not on file    Active member of club or organization: Not on file    Attends meetings of clubs or organizations: Not on file    Relationship status: Not on file  . Intimate partner violence    Fear of current or ex partner: Not on file    Emotionally abused: Not on file    Physically abused: Not on file    Forced sexual activity: Not on file  Other Topics Concern  . Not on file  Social History Narrative  . Not on file    Review of Systems Per HPI.   Objective:   Vitals:   06/17/19 1404  BP: 119/73  Pulse: 74  Temp: 97.9 F (36.6 C)  TempSrc: Oral  SpO2: 97%  Weight: 194 lb 9.6 oz (88.3 kg)     Physical Exam Vitals signs reviewed.  Constitutional:      General: He is not in acute distress.    Appearance: He is well-developed.  HENT:     Head: Normocephalic and atraumatic.  Cardiovascular:     Rate and Rhythm: Normal rate.  Pulmonary:     Effort: Pulmonary effort is normal.  Musculoskeletal:     Comments: Back: Good range of motion, able to ambulate without difficulty.  Negative seated straight leg raise bilaterally, negative supine straight leg raise bilaterally.  No focal midline bony tenderness, sciatic notch nontender.  SI joint nontender.  Left hip pain-free range of motion, negative FABER, negative FADIR.  No clunk/snapping palpated, troch bursa nontender.   Neurological:     Mental Status: He is alert and oriented to person, place, and time.     Dg Lumbar Spine Complete  Result Date: 06/17/2019 CLINICAL DATA:  Recurrent low back pain. EXAM: LUMBAR SPINE - COMPLETE 4+ VIEW COMPARISON:  November 04, 2009. FINDINGS: Minimal grade 1 anterolisthesis of L4-5 is noted secondary to posterior facet joint hypertrophy. No fracture is noted. Disc spaces are well-maintained. IMPRESSION: Minimal grade 1 anterolisthesis of L4-5 secondary to posterior facet joint  hypertrophy. No other abnormality seen in the lumbar spine. Electronically Signed   By: Marijo Conception M.D.   On: 06/17/2019 15:07      Assessment & Plan:  Stephen Obrien is a 53 y.o. male . Bilateral low back pain with bilateral sciatica, unspecified chronicity - Plan: DG Lumbar Spine Complete, Sedimentation Rate, C-reactive protein, predniSONE (DELTASONE) 20 MG tablet,  Ambulatory referral to Physical Therapy  Pain of left hip joint - Plan: DG Lumbar Spine Complete, Sedimentation Rate, C-reactive protein  Overall reassuring exam, specifically hip exam without concerning findings.  Possible lumbosacral source with degenerative changes noted on imaging including grade 1 spondylolisthesis.  Overall reassuring imaging.  -Check sed rate, CRP for inflammatory cause, refer to PT, symptomatic care with Aleve, other per handout  - prednisone given if acute flare with potential side effects and risk discussed.  -Recheck 4 weeks, ER/RTC precautions if worsening sooner  Meds ordered this encounter  Medications  . predniSONE (DELTASONE) 20 MG tablet    Sig: 3 by mouth for 3 days, then 2 by mouth for 2 days, then 1 by mouth for 2 days, then 1/2 by mouth for 2 days.    Dispense:  16 tablet    Refill:  0   Patient Instructions    I will check an x-ray and let you know if there are any concerns as well as the inflammation tests. For now okay to continue Aleve up to twice per day as needed for pain, range of motion stretches, heat to affected area or ice if that feels better. If worsening pain, sciatica symptoms, can start prednisone again -printed prescription. I will refer you to physical therapy.  Follow-up in 4 weeks, which can be a telemedicine visit.Marland Kitchen Return to the clinic or go to the nearest emergency room if any of your symptoms worsen or new symptoms occur.    If you have lab work done today you will be contacted with your lab results within the next 2 weeks.  If you have not heard from  [place] then please contact us. The fastest way to get your results is to register for My Chart.   IF you received an x-ray today, you will receive an invoice from [facility]. Please contact [facility] at [phone] with questions or concerns regarding your invoice.   IF you received labwork today, you will receive an invoice from [facility]. Please contact [facility] at [phone] with questions or concerns regarding your invoice.   Our billing staff will not be able to assist you with questions regarding bills from these companies.  You will be contacted with the lab results as soon as they are available. The fastest way to get your results is to activate your My Chart account. Instructions are located on the last page of this paperwork. If you have not heard from [place] regarding the results in 2 weeks, please contact this office.          Signed, [name], MD [facility]

## 2019-06-18 ENCOUNTER — Encounter: Payer: Self-pay | Admitting: Family Medicine

## 2019-06-18 LAB — C-REACTIVE PROTEIN: CRP: <1 mg/L (ref 0–10)

## 2019-06-18 LAB — SEDIMENTATION RATE: Sed Rate: 4 mm/hr (ref 0–30)

## 2019-06-27 DIAGNOSIS — M545 Low back pain: Secondary | ICD-10-CM | POA: Diagnosis not present

## 2019-06-27 DIAGNOSIS — M5416 Radiculopathy, lumbar region: Secondary | ICD-10-CM | POA: Diagnosis not present

## 2019-07-04 DIAGNOSIS — M5416 Radiculopathy, lumbar region: Secondary | ICD-10-CM | POA: Diagnosis not present

## 2019-07-04 DIAGNOSIS — M545 Low back pain: Secondary | ICD-10-CM | POA: Diagnosis not present

## 2019-07-09 DIAGNOSIS — M545 Low back pain: Secondary | ICD-10-CM | POA: Diagnosis not present

## 2019-07-09 DIAGNOSIS — M5416 Radiculopathy, lumbar region: Secondary | ICD-10-CM | POA: Diagnosis not present

## 2019-07-09 DIAGNOSIS — L501 Idiopathic urticaria: Secondary | ICD-10-CM | POA: Diagnosis not present

## 2019-07-16 DIAGNOSIS — M5416 Radiculopathy, lumbar region: Secondary | ICD-10-CM | POA: Diagnosis not present

## 2019-07-16 DIAGNOSIS — M545 Low back pain: Secondary | ICD-10-CM | POA: Diagnosis not present

## 2019-07-22 ENCOUNTER — Telehealth (INDEPENDENT_AMBULATORY_CARE_PROVIDER_SITE_OTHER): Payer: BC Managed Care – PPO | Admitting: Family Medicine

## 2019-07-22 ENCOUNTER — Other Ambulatory Visit: Payer: Self-pay

## 2019-07-22 DIAGNOSIS — M79605 Pain in left leg: Secondary | ICD-10-CM

## 2019-07-22 DIAGNOSIS — M5441 Lumbago with sciatica, right side: Secondary | ICD-10-CM

## 2019-07-22 DIAGNOSIS — M25552 Pain in left hip: Secondary | ICD-10-CM

## 2019-07-22 DIAGNOSIS — M79604 Pain in right leg: Secondary | ICD-10-CM

## 2019-07-22 DIAGNOSIS — M5442 Lumbago with sciatica, left side: Secondary | ICD-10-CM

## 2019-07-22 NOTE — Progress Notes (Signed)
Virtual Visit via Telephone Note  I connected with Stephen Obrien on 07/22/19 at 6:14 PM by telephone and verified that I am speaking with the correct person using two identifiers.   I discussed the limitations, risks, security and privacy concerns of performing an evaluation and management service by telephone and the availability of in person appointments. I also discussed with the patient that there may be a patient responsible charge related to this service. The patient expressed understanding and agreed to proceed, consent obtained  Chief complaint: Back, hip, right/ left leg pain.  History of Present Illness: Stephen Obrien is a 54 y.o. male  Treated November 30 for bilateral low back pain with bilateral sciatica.  X-ray indicated minimal grade 1 anterolisthesis of L4-5 secondary to posterior facet joint hypertrophy.  No fracture, disc spaces were otherwise well-maintained.  He had a normal sed rate of 4,Normal C-reactive protein less than 1.  Reassuring hip exam at that time, possible lumbosacral source of leg pain, hip pain.  Aleve for symptomatic care, with option of prednisone if acute flare, referred to physical therapy.  Since last visit:  Maybe a little bit better.  Has been working with physical therapist at [facility], 4 sessions so far, but home exercise program daily and stretching - 1 hour per day. Mild to moderate improvement at this point.  Took prednisone after last visit. Not sure it made much difference.  Intermittent sx's - once certain movement can cause the soreness, such as pelvic thrust, but better to lean forward. Still in back of pelvis, down back of legs bilaterrally, but not as far down leg. Occasional pain into front of hip - less often.  Able to walk 18 holes and play golf Saturday.   advil as needed only, not regular use.  No bowel or bladder incontinence, no saddle anesthesia, no lower extremity weakness, no fever/night sweats/weight loss.       Patient Active Problem List   Diagnosis Date Noted  . OSA on CPAP 11/21/2018  . Insomnia 09/10/2015  . Allergic urticaria 09/06/2013  . Other and unspecified hyperlipidemia 09/06/2013   Past Medical History:  Diagnosis Date  . Allergy   . Anxiety   . Arthritis   . Hyperlipidemia   . OSA on CPAP 11/21/2018   Past Surgical History:  Procedure Laterality Date  . DISTAL BICEPS TENDON REPAIR  06/2018  . [place]  . VASECTOMY    . WISDOM TOOTH EXTRACTION  1983   No Known Allergies Prior to Admission medications   Medication Sig Start Date End Date Taking? Authorizing Provider  Ascorbic Acid (VITAMIN C) 100 MG tablet Take by mouth.    [provider]  ascorbic acid (VITAMIN C) 100 MG tablet Take by mouth.    [provider]  atorvastatin (LIPITOR) 40 MG tablet TAKE 1 TABLET(40 MG) BY MOUTH DAILY AT 6 PM 11/21/18   [name], MD  Cholecalciferol (VITAMIN D3) 10000 units capsule  05/18/17   [provider]  levocetirizine (XYZAL) 5 MG tablet Take 1 tablet (5 mg total) by mouth every evening. 09/10/15   [name], MD  Melatonin 5 MG CAPS Take by mouth.    [provider]  Omalizumab [name] ) Inject 150 mg into the skin.     [provider]  predniSONE (DELTASONE) 20 MG tablet 3 by mouth for 3 days, then 2 by mouth for 2 days, then 1 by mouth for 2 days, then 1/2  by mouth for 2 days. 06/17/19   [name], MD  SF 1.1 % GEL dental gel USE IN MOUTH TRAYS AFTER BRUSHING WITH TOOTHPASTE 11/16/15   [provider]   Social History   Socioeconomic History  . Marital status: Married    Spouse name: Not on file  . Number of children: Not on file  . Years of education: Not on file  . Highest education level: Not on file  Occupational History  . Not on file  Tobacco Use  . Smoking status: Never Smoker  . Smokeless tobacco: Never Used  Substance and Sexual Activity  . Alcohol use: Yes    Alcohol/week:  4.0 standard drinks    Types: 4 Standard drinks or equivalent per week    Comment: wine  . Drug use: No  . Sexual activity: Yes  Other Topics Concern  . Not on file  Social History Narrative  . Not on file   Social Determinants of Health   Financial Resource Strain:   . Difficulty of Paying Living Expenses: Not on file  Food Insecurity:   . Worried About Charity fundraiser in the Last Year: Not on file  . Ran Out of Food in the Last Year: Not on file  Transportation Needs:   . Lack of Transportation (Medical): Not on file  . Lack of Transportation (Non-Medical): Not on file  Physical Activity:   . Days of Exercise per Week: Not on file  . Minutes of Exercise per Session: Not on file  Stress:   . Feeling of Stress : Not on file  Social Connections:   . Frequency of Communication with Friends and Family: Not on file  . Frequency of Social Gatherings with Friends and Family: Not on file  . Attends Religious Services: Not on file  . Active Member of Clubs or Organizations: Not on file  . Attends Archivist Meetings: Not on file  . Marital Status: Not on file  Intimate Partner Violence:   . Fear of Current or Ex-Partner: Not on file  . Emotionally Abused: Not on file  . Physically Abused: Not on file  . Sexually Abused: Not on file     Observations/Objective: No distress, appropriate responses, all questions answered.  Assessment and Plan: Bilateral low back pain with bilateral sciatica, unspecified chronicity - Plan: Ambulatory referral to Spine Surgery  Pain of left hip joint - Plan: Ambulatory referral to Spine Surgery  Pain in both lower extremities - Plan: Ambulatory referral to Spine Surgery  Suspected degenerative grade 1 spondylolisthesis, mild to moderate improvement of pain/sciatica symptoms with physical therapy.  Denies any new symptoms or red flag symptoms on history.  Will refer him to back specialist to evaluate for change in plan or potential  need for advanced imaging, continue physical therapy with occasional NSAID if needed for now with RTC precautions given.  Follow Up Instructions:  As needed.    I discussed the assessment and treatment plan with the patient. The patient was provided an opportunity to ask questions and all were answered. The patient agreed with the plan and demonstrated an understanding of the instructions.   The patient was advised to call back or seek an in-person evaluation if the symptoms worsen or if the condition fails to improve as anticipated.  I provided 11 minutes of non-face-to-face time during this encounter.  Signed,   [name], MD [facility].  07/22/19

## 2019-07-22 NOTE — Patient Instructions (Signed)
I will refer you to be evaluated by Dr. Lynann Bologna but continue physical therapy at this point.  Occasional Advil if needed.  Let me know if there are questions or any worsening/new symptoms.

## 2019-07-22 NOTE — Progress Notes (Signed)
Pt complains of pain in L and R legs,hips, and back. Pain level 3/10. pain is intermediate. Pain is sharp, dull, ache. Pt is still taking the same medications. No falls or depression per pt.

## 2019-07-23 DIAGNOSIS — M5416 Radiculopathy, lumbar region: Secondary | ICD-10-CM | POA: Diagnosis not present

## 2019-07-23 DIAGNOSIS — M545 Low back pain: Secondary | ICD-10-CM | POA: Diagnosis not present

## 2019-07-25 DIAGNOSIS — M545 Low back pain: Secondary | ICD-10-CM | POA: Diagnosis not present

## 2019-07-25 DIAGNOSIS — M5416 Radiculopathy, lumbar region: Secondary | ICD-10-CM | POA: Diagnosis not present

## 2019-07-29 DIAGNOSIS — M4696 Unspecified inflammatory spondylopathy, lumbar region: Secondary | ICD-10-CM | POA: Diagnosis not present

## 2019-07-29 DIAGNOSIS — M48061 Spinal stenosis, lumbar region without neurogenic claudication: Secondary | ICD-10-CM | POA: Diagnosis not present

## 2019-07-30 DIAGNOSIS — M545 Low back pain: Secondary | ICD-10-CM | POA: Diagnosis not present

## 2019-07-30 DIAGNOSIS — M5416 Radiculopathy, lumbar region: Secondary | ICD-10-CM | POA: Diagnosis not present

## 2019-08-01 DIAGNOSIS — M545 Low back pain: Secondary | ICD-10-CM | POA: Diagnosis not present

## 2019-08-01 DIAGNOSIS — M5416 Radiculopathy, lumbar region: Secondary | ICD-10-CM | POA: Diagnosis not present

## 2019-08-06 DIAGNOSIS — M5416 Radiculopathy, lumbar region: Secondary | ICD-10-CM | POA: Diagnosis not present

## 2019-08-06 DIAGNOSIS — M545 Low back pain: Secondary | ICD-10-CM | POA: Diagnosis not present

## 2019-08-06 DIAGNOSIS — L501 Idiopathic urticaria: Secondary | ICD-10-CM | POA: Diagnosis not present

## 2019-08-07 DIAGNOSIS — M545 Low back pain: Secondary | ICD-10-CM | POA: Diagnosis not present

## 2019-08-14 DIAGNOSIS — M545 Low back pain: Secondary | ICD-10-CM | POA: Diagnosis not present

## 2019-08-14 DIAGNOSIS — M5416 Radiculopathy, lumbar region: Secondary | ICD-10-CM | POA: Diagnosis not present

## 2019-08-14 DIAGNOSIS — M48062 Spinal stenosis, lumbar region with neurogenic claudication: Secondary | ICD-10-CM | POA: Diagnosis not present

## 2019-08-15 DIAGNOSIS — M5416 Radiculopathy, lumbar region: Secondary | ICD-10-CM | POA: Diagnosis not present

## 2019-08-15 DIAGNOSIS — M545 Low back pain: Secondary | ICD-10-CM | POA: Diagnosis not present

## 2019-08-20 DIAGNOSIS — M5416 Radiculopathy, lumbar region: Secondary | ICD-10-CM | POA: Diagnosis not present

## 2019-08-20 DIAGNOSIS — M545 Low back pain: Secondary | ICD-10-CM | POA: Diagnosis not present

## 2019-08-22 DIAGNOSIS — M5416 Radiculopathy, lumbar region: Secondary | ICD-10-CM | POA: Diagnosis not present

## 2019-08-22 DIAGNOSIS — M545 Low back pain: Secondary | ICD-10-CM | POA: Diagnosis not present

## 2019-08-27 DIAGNOSIS — M5416 Radiculopathy, lumbar region: Secondary | ICD-10-CM | POA: Diagnosis not present

## 2019-08-27 DIAGNOSIS — M545 Low back pain: Secondary | ICD-10-CM | POA: Diagnosis not present

## 2019-08-29 DIAGNOSIS — M545 Low back pain: Secondary | ICD-10-CM | POA: Diagnosis not present

## 2019-08-29 DIAGNOSIS — M5416 Radiculopathy, lumbar region: Secondary | ICD-10-CM | POA: Diagnosis not present

## 2019-09-03 DIAGNOSIS — L501 Idiopathic urticaria: Secondary | ICD-10-CM | POA: Diagnosis not present

## 2019-09-03 DIAGNOSIS — M545 Low back pain: Secondary | ICD-10-CM | POA: Diagnosis not present

## 2019-09-03 DIAGNOSIS — M5416 Radiculopathy, lumbar region: Secondary | ICD-10-CM | POA: Diagnosis not present

## 2019-09-20 ENCOUNTER — Ambulatory Visit: Payer: BLUE CROSS/BLUE SHIELD | Attending: Internal Medicine

## 2019-09-20 DIAGNOSIS — Z23 Encounter for immunization: Secondary | ICD-10-CM | POA: Insufficient documentation

## 2019-09-20 NOTE — Progress Notes (Signed)
   Covid-19 Vaccination Clinic  Name:  Stephen Obrien    MRN: [id number] DOB: 02-18-1966  09/20/2019  [name] was observed post Covid-19 immunization for 15 minutes without incident. He was provided with Vaccine Information Sheet and instruction to access the V-Safe system.   [name] was instructed to call 911 with any severe reactions post vaccine: Marland Kitchen Difficulty breathing  . Swelling of face and throat  . A fast heartbeat  . A bad rash all over body  . Dizziness and weakness

## 2019-10-01 DIAGNOSIS — L501 Idiopathic urticaria: Secondary | ICD-10-CM | POA: Diagnosis not present

## 2019-10-08 ENCOUNTER — Other Ambulatory Visit: Payer: Self-pay | Admitting: Family Medicine

## 2019-10-08 DIAGNOSIS — E785 Hyperlipidemia, unspecified: Secondary | ICD-10-CM

## 2019-10-23 ENCOUNTER — Ambulatory Visit: Payer: BLUE CROSS/BLUE SHIELD

## 2019-10-25 ENCOUNTER — Ambulatory Visit: Payer: BLUE CROSS/BLUE SHIELD

## 2019-10-25 ENCOUNTER — Ambulatory Visit: Payer: BLUE CROSS/BLUE SHIELD | Attending: Internal Medicine

## 2019-10-25 DIAGNOSIS — Z23 Encounter for immunization: Secondary | ICD-10-CM

## 2019-10-25 NOTE — Progress Notes (Signed)
   Covid-19 Vaccination Clinic  Name:  Stephen Obrien    MRN: [id number] DOB: [birth year]  10/25/2019  Mr. Leadbeater was observed post Covid-19 immunization for 15 minutes without incident. He was provided with Vaccine Information Sheet and instruction to access the V-Safe system.   Mr. Dilone was instructed to call 911 with any severe reactions post vaccine: Marland Kitchen Difficulty breathing  . Swelling of face and throat  . A fast heartbeat  . A bad rash all over body  . Dizziness and weakness   Immunizations Administered    Name Date Dose VIS Date Route   Pfizer COVID-19 Vaccine 10/25/2019 10:03 AM 0.3 mL 06/28/2019 Intramuscular   Manufacturer: [employer], [employer]   Lot: [id number]   [place]: [id number]

## 2019-10-29 DIAGNOSIS — L501 Idiopathic urticaria: Secondary | ICD-10-CM | POA: Diagnosis not present

## 2019-10-30 ENCOUNTER — Ambulatory Visit: Payer: BLUE CROSS/BLUE SHIELD

## 2019-11-07 ENCOUNTER — Telehealth: Payer: Self-pay

## 2019-11-07 NOTE — Telephone Encounter (Signed)
NA

## 2019-11-13 ENCOUNTER — Telehealth: Payer: Self-pay | Admitting: Family Medicine

## 2019-11-13 DIAGNOSIS — Z125 Encounter for screening for malignant neoplasm of prostate: Secondary | ICD-10-CM

## 2019-11-13 DIAGNOSIS — Z131 Encounter for screening for diabetes mellitus: Secondary | ICD-10-CM

## 2019-11-13 DIAGNOSIS — E785 Hyperlipidemia, unspecified: Secondary | ICD-10-CM

## 2019-11-13 NOTE — Telephone Encounter (Signed)
Will need to know if he wants to have PSA drawn as well. Usually discuss risks/benefits of that testing in office. Had last year and ok.

## 2019-11-13 NOTE — Telephone Encounter (Signed)
Pt has an upcoming cpe appointment on 11/22/19 I have scheduled his nurse visit for 11/19/19 please put lab orders in.

## 2019-11-19 ENCOUNTER — Other Ambulatory Visit: Payer: Self-pay

## 2019-11-19 ENCOUNTER — Ambulatory Visit: Payer: BLUE CROSS/BLUE SHIELD

## 2019-11-19 DIAGNOSIS — Z131 Encounter for screening for diabetes mellitus: Secondary | ICD-10-CM | POA: Diagnosis not present

## 2019-11-19 DIAGNOSIS — E785 Hyperlipidemia, unspecified: Secondary | ICD-10-CM

## 2019-11-19 DIAGNOSIS — Z125 Encounter for screening for malignant neoplasm of prostate: Secondary | ICD-10-CM | POA: Diagnosis not present

## 2019-11-22 ENCOUNTER — Encounter: Payer: Self-pay | Admitting: Family Medicine

## 2019-11-22 ENCOUNTER — Other Ambulatory Visit: Payer: Self-pay

## 2019-11-22 ENCOUNTER — Ambulatory Visit (INDEPENDENT_AMBULATORY_CARE_PROVIDER_SITE_OTHER): Payer: BLUE CROSS/BLUE SHIELD | Admitting: Family Medicine

## 2019-11-22 VITALS — BP 105/67 | HR 72 | Temp 98.4°F | Ht 72.5 in | Wt 191.0 lb

## 2019-11-22 DIAGNOSIS — Z0000 Encounter for general adult medical examination without abnormal findings: Secondary | ICD-10-CM

## 2019-11-22 DIAGNOSIS — Z0001 Encounter for general adult medical examination with abnormal findings: Secondary | ICD-10-CM

## 2019-11-22 DIAGNOSIS — E785 Hyperlipidemia, unspecified: Secondary | ICD-10-CM

## 2019-11-22 MED ORDER — ATORVASTATIN CALCIUM 20 MG PO TABS: 20.0000 mg | ORAL_TABLET | Freq: Every day | ORAL | 3 refills | Status: DC

## 2019-11-22 NOTE — Progress Notes (Signed)
Subjective:  Patient ID: Stephen Obrien, male    DOB: Oct 13, 1965  Age: 54 y.o. MRN: [id number]  CC:  Chief Complaint  Patient presents with  . Annual Exam    pt states as far as his general health he feels great. pt states he has lost some waight and it has greatly improved his health. no CPAP and blood work seems to be better. pt states his BP has been doing better as well.Pt has been to spinal specilist and has an update for provider.    HPI Stephen Obrien presents for   Annual physical exam  Hyperlipidemia: Lipitor 40 mg daily. No new side effects, myalgia.  Has lost weight.  Lab Results  Component Value Date   CHOL 115 11/19/2019   HDL 46 11/19/2019   LDLCALC 56 11/19/2019   TRIG 56 11/19/2019   CHOLHDL 2.5 11/19/2019   Lab Results  Component Value Date   ALT 25 11/19/2019   AST 22 11/19/2019   ALKPHOS 74 11/19/2019   BILITOT 0.7 11/19/2019   Obstructive sleep apnea on CPAP Previously on AutoPap, notes from November 2020 from sleep specialist.  Weight loss helped. Home sleep test with mild residual obstructive sleep apnea with AHI 7.4.  Improved from August 2019 when it was 21.1.  Optional AutoPap, pressure reduction optional. Sleeping well. Discontinued CPAP.   Allergic urticaria Allergist - Dr. [name].  treated with Xolair every 28 days, Xyzal. Controlled.   Cancer screening Colonoscopy 01/21/2016. Dr. [name], diverticulosis, internal hemorrhoids. Repeat 10 yrs.  Prostate: PSA normal. DRE deferred today. R/b of testing discussed.   Lab Results  Component Value Date   PSA1 0.7 11/19/2019   PSA1 0.6 11/15/2018   PSA1 0.7 11/06/2017   PSA 0.63 09/10/2015   PSA 0.69 09/09/2014   PSA 0.81 09/06/2013    Immunization History  Administered Date(s) Administered  . Influenza, Seasonal, Injecte, Preservative Fre 06/12/2012  . Influenza,inj,Quad PF,6+ Mos 09/06/2013, 09/09/2014, 09/10/2015, 04/01/2019  . PFIZER SARS-COV-2 Vaccination 09/20/2019, 10/25/2019  . Tdap  04/24/2007, 11/06/2017    Depression screen [facility] 2/9 11/22/2019 06/17/2019 04/01/2019 11/21/2018 12/19/2017  Decreased Interest 0 0 0 0 0  Down, Depressed, Hopeless 0 0 0 0 0  PHQ - 2 Score 0 0 0 0 0   No exam data present  14 months from optho - plans to schedule.   Dental: every 6 months.   Exercise: walking, stretching and core work. Spinal stenosis limiting but improving. Walks with golf.   History Patient Active Problem List   Diagnosis Date Noted  . OSA on CPAP 11/21/2018  . Insomnia 09/10/2015  . Allergic urticaria 09/06/2013  . Other and unspecified hyperlipidemia 09/06/2013   Past Medical History:  Diagnosis Date  . Allergy   . Anxiety   . Arthritis   . Hyperlipidemia   . OSA on CPAP 11/21/2018   Past Surgical History:  Procedure Laterality Date  . DISTAL BICEPS TENDON REPAIR  06/2018  . [place]  . VASECTOMY    . WISDOM TOOTH EXTRACTION  1983   No Known Allergies Prior to Admission medications   Medication Sig Start Date End Date Taking? Authorizing Provider  ascorbic acid (VITAMIN C) 100 MG tablet Take by mouth.   Yes [provider]  atorvastatin (LIPITOR) 40 MG tablet TAKE ONE TABLET BY MOUTH DAILY AT 6:00 PM 10/08/19  Yes [name], MD  Cholecalciferol (VITAMIN D3) 10000 units capsule  05/18/17  Yes [provider]  levocetirizine (  XYZAL) 5 MG tablet Take 1 tablet (5 mg total) by mouth every evening. 09/10/15  Yes Daub, Loura Back, MD  Melatonin 5 MG CAPS Take by mouth.   Yes [provider]  Omalizumab [name] [place]) Inject 150 mg into the skin.    Yes [provider]  SF 1.1 % GEL dental gel USE IN MOUTH TRAYS AFTER BRUSHING WITH TOOTHPASTE 11/16/15  Yes [provider]  Ascorbic Acid (VITAMIN C) 100 MG tablet Take by mouth.    [provider]  predniSONE (DELTASONE) 20 MG tablet 3 by mouth for 3 days, then 2 by mouth for 2 days, then 1 by mouth for 2 days, then 1/2 by mouth for 2 days. 06/17/19    [name], MD   Social History   Socioeconomic History  . Marital status: Married    Spouse name: Not on file  . Number of children: Not on file  . Years of education: Not on file  . Highest education level: Not on file  Occupational History  . Not on file  Tobacco Use  . Smoking status: Never Smoker  . Smokeless tobacco: Never Used  Substance and Sexual Activity  . Alcohol use: Yes    Alcohol/week: 4.0 standard drinks    Types: 4 Standard drinks or equivalent per week    Comment: wine  . Drug use: No  . Sexual activity: Yes  Other Topics Concern  . Not on file  Social History Narrative  . Not on file   Social Determinants of Health   Financial Resource Strain:   . Difficulty of Paying Living Expenses:   Food Insecurity:   . Worried About Charity fundraiser in the Last Year:   . Arboriculturist in the Last Year:   Transportation Needs:   . Film/video editor (Medical):   Marland Kitchen Lack of Transportation (Non-Medical):   Physical Activity:   . Days of Exercise per Week:   . Minutes of Exercise per Session:   Stress:   . Feeling of Stress :   Social Connections:   . Frequency of Communication with Friends and Family:   . Frequency of Social Gatherings with Friends and Family:   . Attends Religious Services:   . Active Member of Clubs or Organizations:   . Attends Archivist Meetings:   Marland Kitchen Marital Status:   Intimate Partner Violence:   . Fear of Current or Ex-Partner:   . Emotionally Abused:   Marland Kitchen Physically Abused:   . Sexually Abused:     Review of Systems  Constitutional: Negative for fatigue and unexpected weight change.  Eyes: Negative for visual disturbance.  Respiratory: Negative for cough, chest tightness and shortness of breath.   Cardiovascular: Negative for chest pain, palpitations and leg swelling.  Gastrointestinal: Negative for abdominal pain and blood in stool.  Neurological: Negative for dizziness, light-headedness and headaches.    13 point review of systems per patient health survey noted.  Negative other than as indicated above or in HPI.    Objective:   Vitals:   11/22/19 0829  BP: 105/67  Pulse: 72  Temp: 98.4 F (36.9 C)  TempSrc: Temporal  SpO2: 97%  Weight: 191 lb (86.6 kg)  Height: 6' 0.5" (1.842 m)     Physical Exam Vitals reviewed.  Constitutional:      Appearance: He is well-developed.  HENT:     Head: Normocephalic and atraumatic.     Right Ear: External ear normal.  Left Ear: External ear normal.  Eyes:     Conjunctiva/sclera: Conjunctivae normal.     Pupils: Pupils are equal, round, and reactive to light.  Neck:     Thyroid: No thyromegaly.  Cardiovascular:     Rate and Rhythm: Normal rate and regular rhythm.     Heart sounds: Normal heart sounds.  Pulmonary:     Effort: Pulmonary effort is normal. No respiratory distress.     Breath sounds: Normal breath sounds. No wheezing.  Abdominal:     General: There is no distension.     Palpations: Abdomen is soft.     Tenderness: There is no abdominal tenderness.  Musculoskeletal:        General: No tenderness. Normal range of motion.     Cervical back: Normal range of motion and neck supple.  Lymphadenopathy:     Cervical: No cervical adenopathy.  Skin:    General: Skin is warm and dry.  Neurological:     Mental Status: He is alert and oriented to person, place, and time.     Deep Tendon Reflexes: Reflexes are normal and symmetric.  Psychiatric:        Behavior: Behavior normal.     Assessment & Plan:  Stephen Obrien is a 54 y.o. male . Annual physical exam  - -anticipatory guidance as below in AVS, screening labs above. Health maintenance items as above in HPI discussed/recommended as applicable.   Hyperlipidemia, unspecified hyperlipidemia type - Plan: atorvastatin (LIPITOR) 20 MG tablet  -commended on weight loss. Trial of lower dose lipitor.   Labs reviewed above.   Meds ordered this encounter  Medications  .  atorvastatin (LIPITOR) 20 MG tablet    Sig: Take 1 tablet (20 mg total) by mouth daily.    Dispense:  90 tablet    Refill:  3    Ok to place on hold if needed.   Patient Instructions    Decrease lipitor to 20mg .  Good work with weight loss. recheck levels in 6 months.   Keeping you healthy  Get these tests  Blood pressure- Have your blood pressure checked once a year by your healthcare provider.  Normal blood pressure is 120/80  Weight- Have your body mass index (BMI) calculated to screen for obesity.  BMI is a measure of body fat based on height and weight. You can also calculate your own BMI at [email or web address].  Cholesterol- Have your cholesterol checked every year.  Diabetes- Have your blood sugar checked regularly if you have high blood pressure, high cholesterol, have a family history of diabetes or if you are overweight.  Screening for Colon Cancer- Colonoscopy starting at age 55.  Screening may begin sooner depending on your family history and other health conditions. Follow up colonoscopy as directed by your Gastroenterologist.  Screening for Prostate Cancer- Both blood work (PSA) and a rectal exam help screen for Prostate Cancer.  Screening begins at age 31 with African-American men and at age 69 with Caucasian men.  Screening may begin sooner depending on your family history.  Take these medicines  Aspirin- One aspirin daily can help prevent Heart disease and Stroke.  Flu shot- Every fall.  Tetanus- Every 10 years.  Zostavax- Once after the age of 38 to prevent Shingles.  Pneumonia shot- Once after the age of 31; if you are younger than 46, ask your healthcare provider if you need a Pneumonia shot.  Take these steps  Don't smoke- If you do smoke, talk to your doctor  about quitting.  For tips on how to quit, go to www.smokefree.gov or call 1-800-QUIT-NOW.  Be physically active- Exercise 5 days a week for at least 30 minutes.  If you are not already  physically active start slow and gradually work up to 30 minutes of moderate physical activity.  Examples of moderate activity include walking briskly, mowing the yard, dancing, swimming, bicycling, etc.  Eat a healthy diet- Eat a variety of healthy food such as fruits, vegetables, low fat milk, low fat cheese, yogurt, lean meant, poultry, fish, beans, tofu, etc. For more information go to www.thenutritionsource.org  Drink alcohol in moderation- Limit alcohol intake to less than two drinks a day. Never drink and drive.  Dentist- Brush and floss twice daily; visit your dentist twice a year.  Depression- Your emotional health is as important as your physical health. If you're feeling down, or losing interest in things you would normally enjoy please talk to your healthcare provider.  Eye exam- Visit your eye doctor every year.  Safe sex- If you may be exposed to a sexually transmitted infection, use a condom.  Seat belts- Seat belts can save your life; always wear one.  Smoke/Carbon Monoxide detectors- These detectors need to be installed on the appropriate level of your home.  Replace batteries at least once a year.  Skin cancer- When out in the sun, cover up and use sunscreen 15 SPF or higher.  Violence- If anyone is threatening you, please tell your healthcare provider.  Living Will/ Health care power of attorney- Speak with your healthcare provider and family.  If you have lab work done today you will be contacted with your lab results within the next 2 weeks.  If you have not heard from [place] then please contact us. The fastest way to get your results is to register for My Chart.   IF you received an x-ray today, you will receive an invoice from [facility]. Please contact [facility] at [phone] with questions or concerns regarding your invoice.   IF you received labwork today, you will receive an invoice from [place]. Please contact [facility] at [phone] with  questions or concerns regarding your invoice.   Our billing staff will not be able to assist you with questions regarding bills from these companies.  You will be contacted with the lab results as soon as they are available. The fastest way to get your results is to activate your My Chart account. Instructions are located on the last page of this paperwork. If you have not heard from [place] regarding the results in 2 weeks, please contact this office.         Signed, [name], MD [facility]

## 2019-11-22 NOTE — Patient Instructions (Addendum)
Decrease lipitor to 20mg .  Good work with weight loss. recheck levels in 6 months.   Keeping you healthy  Get these tests  Blood pressure- Have your blood pressure checked once a year by your healthcare provider.  Normal blood pressure is 120/80  Weight- Have your body mass index (BMI) calculated to screen for obesity.  BMI is a measure of body fat based on height and weight. You can also calculate your own BMI at [email or web address].  Cholesterol- Have your cholesterol checked every year.  Diabetes- Have your blood sugar checked regularly if you have high blood pressure, high cholesterol, have a family history of diabetes or if you are overweight.  Screening for Colon Cancer- Colonoscopy starting at age 24.  Screening may begin sooner depending on your family history and other health conditions. Follow up colonoscopy as directed by your Gastroenterologist.  Screening for Prostate Cancer- Both blood work (PSA) and a rectal exam help screen for Prostate Cancer.  Screening begins at age 82 with African-American men and at age 63 with Caucasian men.  Screening may begin sooner depending on your family history.  Take these medicines  Aspirin- One aspirin daily can help prevent Heart disease and Stroke.  Flu shot- Every fall.  Tetanus- Every 10 years.  Zostavax- Once after the age of 25 to prevent Shingles.  Pneumonia shot- Once after the age of 50; if you are younger than 39, ask your healthcare provider if you need a Pneumonia shot.  Take these steps  Don't smoke- If you do smoke, talk to your doctor about quitting.  For tips on how to quit, go to www.smokefree.gov or call 1-800-QUIT-NOW.  Be physically active- Exercise 5 days a week for at least 30 minutes.  If you are not already physically active start slow and gradually work up to 30 minutes of moderate physical activity.  Examples of moderate activity include walking briskly, mowing the yard, dancing, swimming, bicycling,  etc.  Eat a healthy diet- Eat a variety of healthy food such as fruits, vegetables, low fat milk, low fat cheese, yogurt, lean meant, poultry, fish, beans, tofu, etc. For more information go to www.thenutritionsource.org  Drink alcohol in moderation- Limit alcohol intake to less than two drinks a day. Never drink and drive.  Dentist- Brush and floss twice daily; visit your dentist twice a year.  Depression- Your emotional health is as important as your physical health. If you're feeling down, or losing interest in things you would normally enjoy please talk to your healthcare provider.  Eye exam- Visit your eye doctor every year.  Safe sex- If you may be exposed to a sexually transmitted infection, use a condom.  Seat belts- Seat belts can save your life; always wear one.  Smoke/Carbon Monoxide detectors- These detectors need to be installed on the appropriate level of your home.  Replace batteries at least once a year.  Skin cancer- When out in the sun, cover up and use sunscreen 15 SPF or higher.  Violence- If anyone is threatening you, please tell your healthcare provider.  Living Will/ Health care power of attorney- Speak with your healthcare provider and family.  If you have lab work done today you will be contacted with your lab results within the next 2 weeks.  If you have not heard from [place] then please contact us. The fastest way to get your results is to register for My Chart.   IF you received an x-ray today, you will receive an invoice from [facility].  Please contact [facility] at [phone] with questions or concerns regarding your invoice.   IF you received labwork today, you will receive an invoice from [place]. Please contact [facility] at [phone] with questions or concerns regarding your invoice.   Our billing staff will not be able to assist you with questions regarding bills from these companies.  You will be contacted with the lab results  as soon as they are available. The fastest way to get your results is to activate your My Chart account. Instructions are located on the last page of this paperwork. If you have not heard from [place] regarding the results in 2 weeks, please contact this office.

## 2019-11-23 ENCOUNTER — Encounter: Payer: Self-pay | Admitting: Family Medicine

## 2019-11-25 LAB — COMPREHENSIVE METABOLIC PANEL
ALT: 25 IU/L (ref 0–44)
AST: 22 IU/L (ref 0–40)
Albumin/Globulin Ratio: 2.2 (ref 1.2–2.2)
Albumin: 4.7 g/dL (ref 3.8–4.9)
Alkaline Phosphatase: 74 IU/L (ref 39–117)
BUN/Creatinine Ratio: 16 (ref 9–20)
BUN: 16 mg/dL (ref 6–24)
Bilirubin Total: 0.7 mg/dL (ref 0.0–1.2)
CO2: 27 mmol/L (ref 20–29)
Calcium: 10.1 mg/dL (ref 8.7–10.2)
Chloride: 101 mmol/L (ref 96–106)
Creatinine, Ser: 1.03 mg/dL (ref 0.76–1.27)
GFR calc Af Amer: 95 mL/min/{1.73_m2} (ref >59)
GFR calc non Af Amer: 82 mL/min/{1.73_m2} (ref >59)
Globulin, Total: 2.1 g/dL (ref 1.5–4.5)
Glucose: 91 mg/dL (ref 65–99)
Potassium: 4.6 mmol/L (ref 3.5–5.2)
Sodium: 139 mmol/L (ref 134–144)
Total Protein: 6.8 g/dL (ref 6.0–8.5)

## 2019-11-25 LAB — LIPID PANEL
Chol/HDL Ratio: 2.5 ratio (ref 0.0–5.0)
Cholesterol, Total: 115 mg/dL (ref 100–199)
HDL: 46 mg/dL (ref >39)
LDL Chol Calc (NIH): 56 mg/dL (ref 0–99)
Triglycerides: 56 mg/dL (ref 0–149)
VLDL Cholesterol Cal: 13 mg/dL (ref 5–40)

## 2019-11-25 LAB — HEMOGLOBIN A1C

## 2019-11-25 LAB — PSA: Prostate Specific Ag, Serum: 0.7 ng/mL (ref 0.0–4.0)

## 2019-11-27 ENCOUNTER — Ambulatory Visit: Payer: BLUE CROSS/BLUE SHIELD | Admitting: Adult Health

## 2019-12-06 ENCOUNTER — Other Ambulatory Visit: Payer: Self-pay | Admitting: Family Medicine

## 2019-12-06 DIAGNOSIS — E785 Hyperlipidemia, unspecified: Secondary | ICD-10-CM

## 2019-12-10 DIAGNOSIS — L501 Idiopathic urticaria: Secondary | ICD-10-CM | POA: Diagnosis not present

## 2019-12-13 DIAGNOSIS — J3089 Other allergic rhinitis: Secondary | ICD-10-CM | POA: Diagnosis not present

## 2019-12-13 DIAGNOSIS — J301 Allergic rhinitis due to pollen: Secondary | ICD-10-CM | POA: Diagnosis not present

## 2019-12-13 DIAGNOSIS — L509 Urticaria, unspecified: Secondary | ICD-10-CM | POA: Diagnosis not present

## 2019-12-23 IMAGING — DX DG LUMBAR SPINE COMPLETE 4+V
5 series · 5 of 5 positions shown · non-contrast
Comparison: November 04, 2009.

CLINICAL DATA: Recurrent low back pain.

EXAM:
LUMBAR SPINE - COMPLETE 4+ VIEW

[l-spine ap]
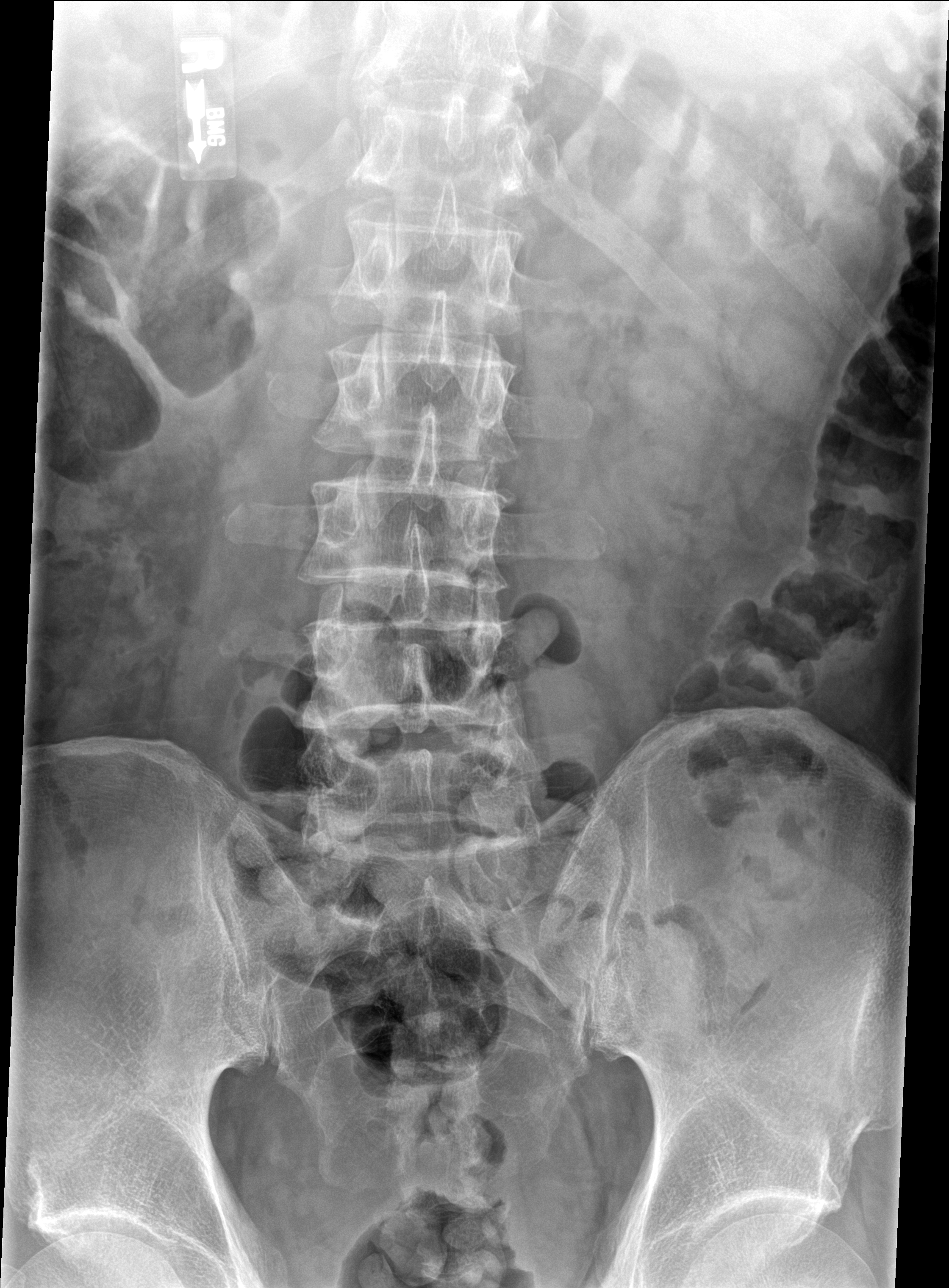

[l-spine obl (1 of 2)]
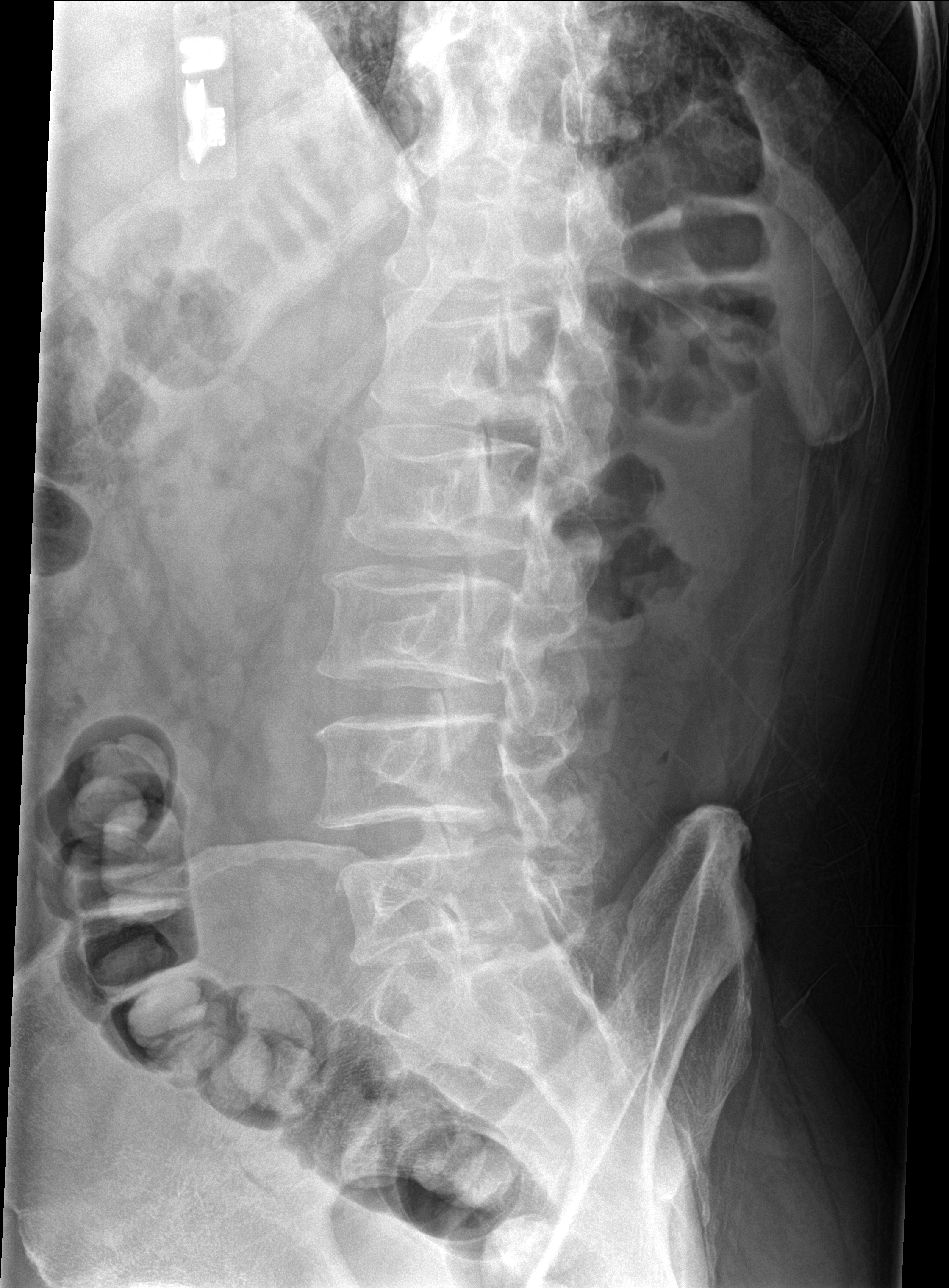

[l-spine obl (2 of 2)]
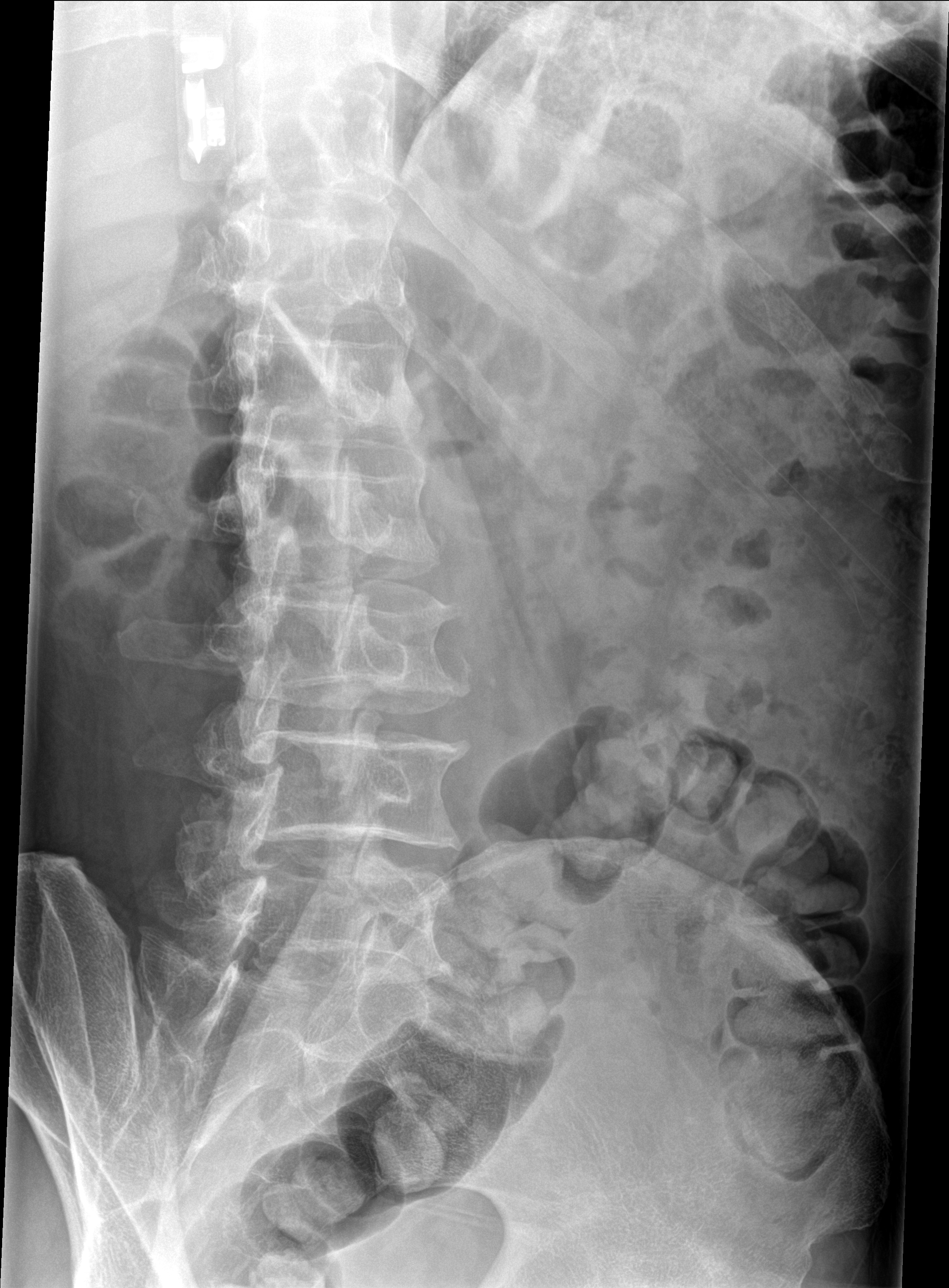

[l-spine lat]
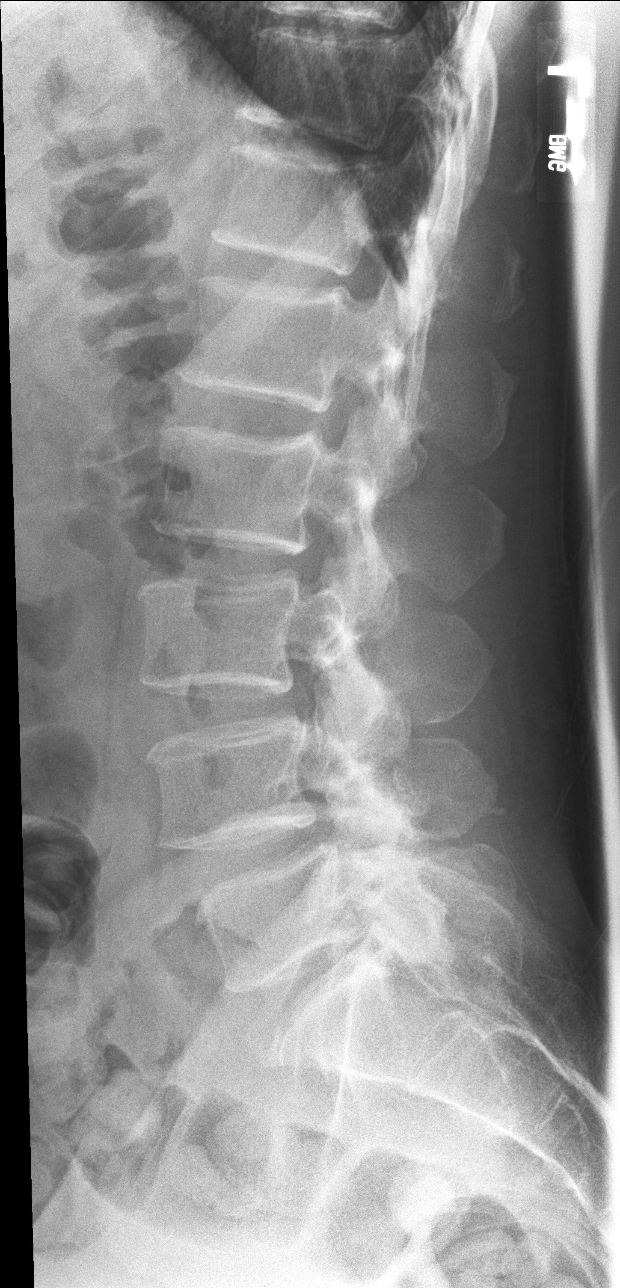

[l-spine l5-s1]
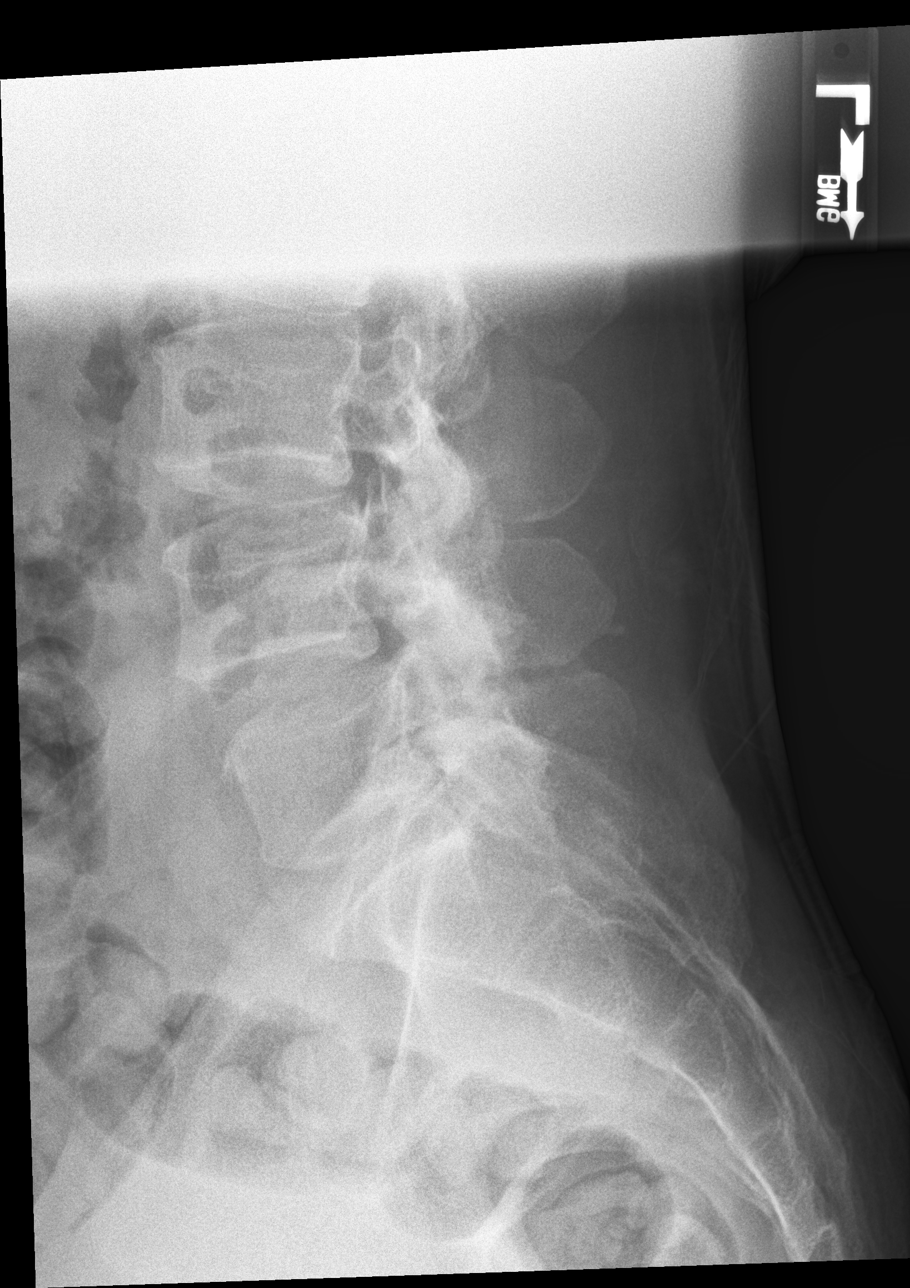

[5 of 5 positions shown; findings below may reference images not displayed]

FINDINGS: Minimal grade 1 anterolisthesis of L4-5 is noted secondary to
posterior facet joint hypertrophy. No fracture is noted. Disc spaces
are well-maintained.
IMPRESSION: Minimal grade 1 anterolisthesis of L4-5 secondary to posterior facet
joint hypertrophy. No other abnormality seen in the lumbar spine.

## 2020-01-07 DIAGNOSIS — L501 Idiopathic urticaria: Secondary | ICD-10-CM | POA: Diagnosis not present

## 2020-02-04 DIAGNOSIS — L501 Idiopathic urticaria: Secondary | ICD-10-CM | POA: Diagnosis not present

## 2020-03-03 DIAGNOSIS — L501 Idiopathic urticaria: Secondary | ICD-10-CM | POA: Diagnosis not present

## 2020-04-01 DIAGNOSIS — L501 Idiopathic urticaria: Secondary | ICD-10-CM | POA: Diagnosis not present

## 2020-05-06 DIAGNOSIS — H5213 Myopia, bilateral: Secondary | ICD-10-CM | POA: Diagnosis not present

## 2020-05-12 DIAGNOSIS — L501 Idiopathic urticaria: Secondary | ICD-10-CM | POA: Diagnosis not present

## 2020-05-21 ENCOUNTER — Other Ambulatory Visit: Payer: Self-pay

## 2020-05-21 ENCOUNTER — Ambulatory Visit (INDEPENDENT_AMBULATORY_CARE_PROVIDER_SITE_OTHER): Payer: BLUE CROSS/BLUE SHIELD | Admitting: Family Medicine

## 2020-05-21 DIAGNOSIS — E785 Hyperlipidemia, unspecified: Secondary | ICD-10-CM

## 2020-05-21 LAB — LIPID PANEL
Chol/HDL Ratio: 3.2 ratio (ref 0.0–5.0)
Cholesterol, Total: 146 mg/dL (ref 100–199)
HDL: 46 mg/dL (ref >39)
LDL Chol Calc (NIH): 88 mg/dL (ref 0–99)
Triglycerides: 57 mg/dL (ref 0–149)
VLDL Cholesterol Cal: 12 mg/dL (ref 5–40)

## 2020-05-27 ENCOUNTER — Encounter: Payer: Self-pay | Admitting: Family Medicine

## 2020-05-27 ENCOUNTER — Other Ambulatory Visit: Payer: Self-pay

## 2020-05-27 ENCOUNTER — Ambulatory Visit (INDEPENDENT_AMBULATORY_CARE_PROVIDER_SITE_OTHER): Payer: BLUE CROSS/BLUE SHIELD | Admitting: Family Medicine

## 2020-05-27 VITALS — BP 120/78 | HR 70 | Temp 97.3°F | Ht 72.5 in | Wt 195.0 lb

## 2020-05-27 DIAGNOSIS — Z23 Encounter for immunization: Secondary | ICD-10-CM | POA: Diagnosis not present

## 2020-05-27 DIAGNOSIS — E785 Hyperlipidemia, unspecified: Secondary | ICD-10-CM | POA: Diagnosis not present

## 2020-05-27 DIAGNOSIS — L300 Nummular dermatitis: Secondary | ICD-10-CM | POA: Diagnosis not present

## 2020-05-27 NOTE — Progress Notes (Signed)
Subjective:  Patient ID: Stephen Obrien, male    DOB: [birth year]  Age: 54 y.o. MRN: [id number]  CC:  Chief Complaint  Patient presents with  . Follow-up    on hyperlipideima. Pt reportd no issues with this condition and no physical issues with this condition. pt has already had labs drawn for this visit and they have been resulted by the provider.    HPI Boen Sterbenz presents for   Hyperlipidemia: Lipitor 20mg  qd. Dose decreased last visit.  No new myalgias with lipitor daily.   Lab Results  Component Value Date   CHOL 146 05/21/2020   HDL 46 05/21/2020   LDLCALC 88 05/21/2020   TRIG 57 05/21/2020   CHOLHDL 3.2 05/21/2020   Lab Results  Component Value Date   ALT 25 11/19/2019   AST 22 11/19/2019   ALKPHOS 74 11/19/2019   BILITOT 0.7 11/19/2019   covid vaccine - plans on booster.  Flu vaccine today.   History Patient Active Problem List   Diagnosis Date Noted  . OSA on CPAP 11/21/2018  . Conductive hearing loss of left ear with unrestricted hearing of right ear 05/15/2017  . Impacted cerumen of left ear 05/15/2017  . Tinnitus of both ears 05/15/2017  . Insomnia 09/10/2015  . Allergic urticaria 09/06/2013  . Other and unspecified hyperlipidemia 09/06/2013   Past Medical History:  Diagnosis Date  . Allergy   . Anxiety   . Arthritis   . Hyperlipidemia   . OSA on CPAP 11/21/2018   Past Surgical History:  Procedure Laterality Date  . DISTAL BICEPS TENDON REPAIR  06/2018  . [place]  . VASECTOMY    . WISDOM TOOTH EXTRACTION  1983   No Known Allergies Prior to Admission medications   Medication Sig Start Date End Date Taking? Authorizing Provider  ascorbic acid (VITAMIN C) 100 MG tablet Take by mouth.   Yes [provider]  atorvastatin (LIPITOR) 20 MG tablet Take 1 tablet (20 mg total) by mouth daily. 11/22/19  Yes [name], MD  Cholecalciferol (VITAMIN D3) 10000 units capsule  05/18/17  Yes [provider]    levocetirizine (XYZAL) 5 MG tablet Take 1 tablet (5 mg total) by mouth every evening. 09/10/15  Yes Daub, Loura Back, MD  Melatonin 5 MG CAPS Take by mouth.   Yes [provider]  Omalizumab [name] [place]) Inject 150 mg into the skin.    Yes [provider]  SF 1.1 % GEL dental gel USE IN MOUTH TRAYS AFTER BRUSHING WITH TOOTHPASTE 11/16/15  Yes [provider]   Social History   Socioeconomic History  . Marital status: Married    Spouse name: Not on file  . Number of children: Not on file  . Years of education: Not on file  . Highest education level: Not on file  Occupational History  . Not on file  Tobacco Use  . Smoking status: Never Smoker  . Smokeless tobacco: Never Used  Vaping Use  . Vaping Use: Never used  Substance and Sexual Activity  . Alcohol use: Yes    Alcohol/week: 4.0 standard drinks    Types: 4 Standard drinks or equivalent per week    Comment: wine  . Drug use: No  . Sexual activity: Yes  Other Topics Concern  . Not on file  Social History Narrative  . Not on file   Social Determinants of Health   Financial Resource Strain:   . Difficulty of Paying  Living Expenses: Not on file  Food Insecurity:   . Worried About Charity fundraiser in the Last Year: Not on file  . Ran Out of Food in the Last Year: Not on file  Transportation Needs:   . Lack of Transportation (Medical): Not on file  . Lack of Transportation (Non-Medical): Not on file  Physical Activity:   . Days of Exercise per Week: Not on file  . Minutes of Exercise per Session: Not on file  Stress:   . Feeling of Stress : Not on file  Social Connections:   . Frequency of Communication with Friends and Family: Not on file  . Frequency of Social Gatherings with Friends and Family: Not on file  . Attends Religious Services: Not on file  . Active Member of Clubs or Organizations: Not on file  . Attends Archivist Meetings: Not on file  . Marital Status: Not on file   Intimate Partner Violence:   . Fear of Current or Ex-Partner: Not on file  . Emotionally Abused: Not on file  . Physically Abused: Not on file  . Sexually Abused: Not on file    Review of Systems Per HPI. No new myalgias.   Objective:   Vitals:   05/27/20 1349  BP: 120/78  Pulse: 70  Temp: (!) 97.3 F (36.3 C)  TempSrc: Temporal  SpO2: 99%  Weight: 195 lb (88.5 kg)  Height: 6' 0.5" (1.842 m)     Physical Exam Vitals reviewed.  Constitutional:      Appearance: He is well-developed.  HENT:     Head: Normocephalic and atraumatic.  Eyes:     Pupils: Pupils are equal, round, and reactive to light.  Neck:     Vascular: No carotid bruit or JVD.  Cardiovascular:     Rate and Rhythm: Normal rate and regular rhythm.     Heart sounds: Normal heart sounds. No murmur heard.   Pulmonary:     Effort: Pulmonary effort is normal.     Breath sounds: Normal breath sounds. No rales.  Skin:    General: Skin is warm and dry.  Neurological:     Mental Status: He is alert and oriented to person, place, and time.        Assessment & Plan:  Felder Lebeda is a 54 y.o. male . Hyperlipidemia, unspecified hyperlipidemia type  -  Stable, tolerating current regimen at lower dose. Recent lipids ok. Recheck in 6 months for physical.   Need for vaccination - Plan: Flu Vaccine QUAD 36+ mos IM   No orders of the defined types were placed in this encounter.  Patient Instructions    Thanks for coming in today. Lower dose lipitor appears to be working well. Take care.    If you have lab work done today you will be contacted with your lab results within the next 2 weeks.  If you have not heard from [place] then please contact us. The fastest way to get your results is to register for My Chart.   IF you received an x-ray today, you will receive an invoice from [facility]. Please contact [facility] at [phone] with questions or concerns regarding your invoice.   IF  you received labwork today, you will receive an invoice from [place]. Please contact [facility] at [phone] with questions or concerns regarding your invoice.   Our billing staff will not be able to assist you with questions regarding bills from these companies.  You will be contacted  with the lab results as soon as they are available. The fastest way to get your results is to activate your My Chart account. Instructions are located on the last page of this paperwork. If you have not heard from us regarding the results in 2 weeks, please contact this office.         Signed, Raiya Stainback, MD [facility]  

## 2020-05-27 NOTE — Patient Instructions (Addendum)
  Thanks for coming in today. Lower dose lipitor appears to be working well. Take care.    If you have lab work done today you will be contacted with your lab results within the next 2 weeks.  If you have not heard from [place] then please contact us. The fastest way to get your results is to register for My Chart.   IF you received an x-ray today, you will receive an invoice from [facility]. Please contact [facility] at [phone] with questions or concerns regarding your invoice.   IF you received labwork today, you will receive an invoice from [facility]. Please contact [facility] at [phone] with questions or concerns regarding your invoice.   Our billing staff will not be able to assist you with questions regarding bills from these companies.  You will be contacted with the lab results as soon as they are available. The fastest way to get your results is to activate your My Chart account. Instructions are located on the last page of this paperwork. If you have not heard from [place] regarding the results in 2 weeks, please contact this office.

## 2020-05-29 ENCOUNTER — Other Ambulatory Visit: Payer: BLUE CROSS/BLUE SHIELD

## 2020-05-29 DIAGNOSIS — Z20822 Contact with and (suspected) exposure to covid-19: Secondary | ICD-10-CM

## 2020-05-30 LAB — NOVEL CORONAVIRUS, NAA: SARS-CoV-2, NAA: NOT DETECTED

## 2020-05-30 LAB — SARS-COV-2, NAA 2 DAY TAT

## 2020-06-15 DIAGNOSIS — L501 Idiopathic urticaria: Secondary | ICD-10-CM | POA: Diagnosis not present

## 2020-06-29 DIAGNOSIS — Z20822 Contact with and (suspected) exposure to covid-19: Secondary | ICD-10-CM | POA: Diagnosis not present

## 2020-07-16 DIAGNOSIS — L501 Idiopathic urticaria: Secondary | ICD-10-CM | POA: Diagnosis not present

## 2020-08-24 DIAGNOSIS — L501 Idiopathic urticaria: Secondary | ICD-10-CM | POA: Diagnosis not present

## 2020-11-26 ENCOUNTER — Other Ambulatory Visit: Payer: Self-pay

## 2020-11-26 ENCOUNTER — Ambulatory Visit (INDEPENDENT_AMBULATORY_CARE_PROVIDER_SITE_OTHER): Payer: BC Managed Care – PPO | Admitting: Family Medicine

## 2020-11-26 ENCOUNTER — Encounter: Payer: Self-pay | Admitting: Family Medicine

## 2020-11-26 VITALS — BP 124/76 | HR 86 | Temp 98.4°F | Resp 16 | Ht 72.0 in | Wt 195.4 lb

## 2020-11-26 DIAGNOSIS — Z125 Encounter for screening for malignant neoplasm of prostate: Secondary | ICD-10-CM

## 2020-11-26 DIAGNOSIS — Z131 Encounter for screening for diabetes mellitus: Secondary | ICD-10-CM

## 2020-11-26 DIAGNOSIS — E785 Hyperlipidemia, unspecified: Secondary | ICD-10-CM | POA: Diagnosis not present

## 2020-11-26 DIAGNOSIS — Z1329 Encounter for screening for other suspected endocrine disorder: Secondary | ICD-10-CM

## 2020-11-26 DIAGNOSIS — Z0000 Encounter for general adult medical examination without abnormal findings: Secondary | ICD-10-CM

## 2020-11-26 DIAGNOSIS — Z130 Encounter for screening for diseases of the blood and blood-forming organs and certain disorders involving the immune mechanism: Secondary | ICD-10-CM

## 2020-11-26 LAB — COMPREHENSIVE METABOLIC PANEL
ALT: 21 U/L (ref 0–53)
AST: 19 U/L (ref 0–37)
Albumin: 4.6 g/dL (ref 3.5–5.2)
Alkaline Phosphatase: 56 U/L (ref 39–117)
BUN: 23 mg/dL (ref 6–23)
CO2: 30 mEq/L (ref 19–32)
Calcium: 9.8 mg/dL (ref 8.4–10.5)
Chloride: 104 mEq/L (ref 96–112)
Creatinine, Ser: 1.05 mg/dL (ref 0.40–1.50)
GFR: 79.78 mL/min (ref >60.00)
Glucose, Bld: 87 mg/dL (ref 70–99)
Potassium: 4.3 mEq/L (ref 3.5–5.1)
Sodium: 140 mEq/L (ref 135–145)
Total Bilirubin: 0.9 mg/dL (ref 0.2–1.2)
Total Protein: 7 g/dL (ref 6.0–8.3)

## 2020-11-26 LAB — HEMOGLOBIN A1C: Hgb A1c MFr Bld: 5.7 % (ref 4.6–6.5)

## 2020-11-26 LAB — LIPID PANEL
Cholesterol: 142 mg/dL (ref 0–200)
HDL: 44.4 mg/dL (ref >39.00)
LDL Cholesterol: 84 mg/dL (ref 0–99)
NonHDL: 97.8
Total CHOL/HDL Ratio: 3
Triglycerides: 70 mg/dL (ref 0.0–149.0)
VLDL: 14 mg/dL (ref 0.0–40.0)

## 2020-11-26 LAB — CBC WITH DIFFERENTIAL/PLATELET
Basophils Absolute: 0 10*3/uL (ref 0.0–0.1)
Basophils Relative: 0.5 % (ref 0.0–3.0)
Eosinophils Absolute: 0.1 10*3/uL (ref 0.0–0.7)
Eosinophils Relative: 2.9 % (ref 0.0–5.0)
HCT: 46.9 % (ref 39.0–52.0)
Hemoglobin: 15.7 g/dL (ref 13.0–17.0)
Lymphocytes Relative: 37 % (ref 12.0–46.0)
Lymphs Abs: 1.8 10*3/uL (ref 0.7–4.0)
MCHC: 33.6 g/dL (ref 30.0–36.0)
MCV: 89.6 fl (ref 78.0–100.0)
Monocytes Absolute: 0.6 10*3/uL (ref 0.1–1.0)
Monocytes Relative: 12.2 % — ABNORMAL HIGH (ref 3.0–12.0)
Neutro Abs: 2.3 10*3/uL (ref 1.4–7.7)
Neutrophils Relative %: 47.4 % (ref 43.0–77.0)
Platelets: 215 10*3/uL (ref 150.0–400.0)
RBC: 5.23 Mil/uL (ref 4.22–5.81)
RDW: 13.1 % (ref 11.5–15.5)
WBC: 4.9 10*3/uL (ref 4.0–10.5)

## 2020-11-26 LAB — TSH: TSH: 1.11 u[IU]/mL (ref 0.35–4.50)

## 2020-11-26 LAB — PSA: PSA: 0.52 ng/mL (ref 0.10–4.00)

## 2020-11-26 MED ORDER — ATORVASTATIN CALCIUM 20 MG PO TABS: 20.0000 mg | ORAL_TABLET | Freq: Every day | ORAL | 3 refills | Status: DC

## 2020-11-26 NOTE — Progress Notes (Signed)
Subjective:  Patient ID: Stephen Obrien, male    DOB: 1965-08-09  Age: 55 y.o. MRN: [id number]  CC:  Chief Complaint  Patient presents with  . Annual Exam    Pt here for physical no concerns, pt is fasting.     HPI Stephen Obrien presents for   Annual physical exam.  Hyperlipidemia: Lipitor 20 mg daily. No new myalgias/side effects.   Wt Readings from Last 3 Encounters:  11/26/20 195 lb 6.4 oz (88.6 kg)  05/27/20 195 lb (88.5 kg)  11/22/19 191 lb (86.6 kg)    Lab Results  Component Value Date   CHOL 146 05/21/2020   HDL 46 05/21/2020   LDLCALC 88 05/21/2020   TRIG 57 05/21/2020   CHOLHDL 3.2 05/21/2020   Lab Results  Component Value Date   ALT 25 11/19/2019   AST 22 11/19/2019   ALKPHOS 74 11/19/2019   BILITOT 0.7 11/19/2019   Obstructive sleep apnea Previous AutoPap treatment, weight loss.  Home sleep test with mild residual OSA with AHI 7.4 down from 21.1.  Optional AutoPap but was sleeping well and has discontinued CPAP.  Melatonin for sleep working well.   Allergic urticaria Treated with Xolair, Xyzal.  Followed by allergist, Dr. [name]. Having to do injections at home d/t coverage for injection. Med is covered.   Cancer screening Colonoscopy 01/21/2016, Dr. [name], diverticulosis with internal hemorrhoids, repeat 10 years. No recent hemorrhoid flare.  Eating fiber in diet.  Lab Results  Component Value Date   PSA1 0.7 11/19/2019   PSA1 0.6 11/15/2018   PSA1 0.7 11/06/2017   PSA 0.63 09/10/2015   PSA 0.69 09/09/2014   PSA 0.81 09/06/2013   The natural history of prostate cancer and ongoing controversy regarding screening and potential treatment outcomes of prostate cancer has been discussed with the patient. The meaning of a false positive PSA and a false negative PSA has been discussed. He indicates understanding of the limitations of this screening test and wishes to proceed with screening PSA testing and DRE.  No nocturia.   Immunization History   Administered Date(s) Administered  . Influenza Inj Mdck Quad Pf 08/25/2018  . Influenza, Seasonal, Injecte, Preservative Fre 06/12/2012  . Influenza,inj,Quad PF,6+ Mos 09/06/2013, 09/09/2014, 09/10/2015, 04/01/2019, 05/27/2020  . PFIZER(Purple Top)SARS-COV-2 Vaccination 09/20/2019, 10/25/2019, 06/03/2020  . Tdap 04/24/2007, 11/06/2017  . Zoster Recombinat (Shingrix) 03/30/2018, 08/25/2018    Depression screen [facility] 2/9 11/26/2020 11/22/2019 06/17/2019 04/01/2019 11/21/2018  Decreased Interest 0 0 0 0 0  Down, Depressed, Hopeless 0 0 0 0 0  PHQ - 2 Score 0 0 0 0 0    Hearing Screening   125Hz  250Hz  500Hz  1000Hz  2000Hz  3000Hz  4000Hz  6000Hz  8000Hz   Right ear:           Left ear:             Visual Acuity Screening   Right eye Left eye Both eyes  Without correction: 20/20-1 20/20 20/15-1  With correction:     optho eval yearly   Dental: every 6 months,   Exercise: fast paced walking, some bike riding, walking with golf. Band work for strength. Core work for spinal stenosis.   Alcohol/tobacco: 4-6 drinks per week. Sister with alcoholism. Some family discord, disagreement about dealing with her addiction. FH of alcoholism. 73 and 57 yo children are doing ok - no sign of problem drinking in them.  Intervention few years ago. Discussed Al-anon, option of counseling. No tobacco.   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 Covering [employer] at this time.   History Patient Active Problem List   Diagnosis Date Noted  . OSA on CPAP 11/21/2018  . Conductive hearing loss of left ear with unrestricted hearing of right ear 05/15/2017  . Impacted cerumen of left ear 05/15/2017  . Tinnitus of both ears 05/15/2017  . Insomnia 09/10/2015  . Allergic urticaria 09/06/2013  . Other and unspecified hyperlipidemia 09/06/2013   Past Medical History:  Diagnosis Date  . Allergy   . Anxiety   . Arthritis   . Hyperlipidemia   . OSA  on CPAP 11/21/2018   Past Surgical History:  Procedure Laterality Date  . DISTAL BICEPS TENDON REPAIR  06/2018  . [place]  . VASECTOMY    . WISDOM TOOTH EXTRACTION  1983   No Known Allergies Prior to Admission medications   Medication Sig Start Date End Date Taking? Authorizing Provider  ascorbic acid (VITAMIN C) 100 MG tablet Take by mouth.   Yes [provider]  atorvastatin (LIPITOR) 20 MG tablet Take 1 tablet (20 mg total) by mouth daily. 11/22/19  Yes [name], MD  Cholecalciferol (VITAMIN D3) 10000 units capsule  05/18/17  Yes [provider]  levocetirizine (XYZAL) 5 MG tablet Take 1 tablet (5 mg total) by mouth every evening. 09/10/15  Yes Daub, Loura Back, MD  Melatonin 5 MG CAPS Take by mouth.   Yes [provider]  Omalizumab [name] Dana) Inject 150 mg into the skin.    Yes [provider]   Social History   Socioeconomic History  . Marital status: Married    Spouse name: Not on file  . Number of children: Not on file  . Years of education: Not on file  . Highest education level: Not on file  Occupational History  . Not on file  Tobacco Use  . Smoking status: Never Smoker  . Smokeless tobacco: Never Used  Vaping Use  . Vaping Use: Never used  Substance and Sexual Activity  . Alcohol use: Yes    Alcohol/week: 4.0 standard drinks    Types: 4 Standard drinks or equivalent per week    Comment: wine  . Drug use: No  . Sexual activity: Yes  Other Topics Concern  . Not on file  Social History Narrative  . Not on file   Social Determinants of Health   Financial Resource Strain: Not on file  Food Insecurity: Not on file  Transportation Needs: Not on file  Physical Activity: Not on file  Stress: Not on file  Social Connections: Not on file  Intimate Partner Violence: Not on file    Review of Systems 13 point review of systems per patient health survey noted if applicable,   Negative other than as indicated  above or in HPI.    Objective:   Vitals:   11/26/20 0858  BP: 124/76  Pulse: 86  Resp: 16  Temp: 98.4 F (36.9 C)  TempSrc: Temporal  SpO2: 96%  Weight: 195 lb 6.4 oz (88.6 kg)  Height: 6' (1.829 m)     Physical Exam Vitals reviewed.  Constitutional:      Appearance: He is well-developed.  HENT:     Head: Normocephalic and atraumatic.     Right Ear: External ear normal.     Left Ear: External ear normal.  Eyes:     Conjunctiva/sclera: Conjunctivae normal.     Pupils: Pupils  are equal, round, and reactive to light.  Neck:     Thyroid: No thyromegaly.  Cardiovascular:     Rate and Rhythm: Normal rate and regular rhythm.     Heart sounds: Normal heart sounds.  Pulmonary:     Effort: Pulmonary effort is normal. No respiratory distress.     Breath sounds: Normal breath sounds. No wheezing.  Abdominal:     General: There is no distension.     Palpations: Abdomen is soft.     Tenderness: There is no abdominal tenderness.     Hernia: There is no hernia in the left inguinal area or right inguinal area.  Genitourinary:    Prostate: Normal.  Musculoskeletal:        General: No tenderness. Normal range of motion.     Cervical back: Normal range of motion and neck supple.  Lymphadenopathy:     Cervical: No cervical adenopathy.  Skin:    General: Skin is warm and dry.  Neurological:     Mental Status: He is alert and oriented to person, place, and time.     Deep Tendon Reflexes: Reflexes are normal and symmetric.  Psychiatric:        Behavior: Behavior normal.     Assessment & Plan:  Stephen Obrien is a 55 y.o. male . Annual physical exam - Plan: CBC with Differential/Platelet, Comprehensive metabolic panel, Hemoglobin A1c, Lipid panel, PSA  - -anticipatory guidance as below in AVS, screening labs above. Health maintenance items as above in HPI discussed/recommended as applicable.   - considering Covid 2nd booster  - discussed Al-Anon or I can provide other counselor  numbers for assistance with family member with alcoholism.   Hyperlipidemia, unspecified hyperlipidemia type - Plan: Lipid panel, atorvastatin (LIPITOR) 20 MG tablet  -  Stable, tolerating current regimen. Medications refilled. Labs pending as above.   Screening for prostate cancer - Plan: PSA  -  We discussed pros and cons of prostate cancer screening, and after this discussion, he chose to have screening done. PSA obtained, and no concerning findings on DRE.   Screening for diabetes mellitus (DM) - Plan: Comprehensive metabolic panel, Hemoglobin A1c  Screening, anemia, deficiency, iron  Screening for thyroid disorder - Plan: TSH   Meds ordered this encounter  Medications  . atorvastatin (LIPITOR) 20 MG tablet    Sig: Take 1 tablet (20 mg total) by mouth daily.    Dispense:  90 tablet    Refill:  3    Ok to place on hold if needed.   Patient Instructions   Thanks for coming in today. Try Al-Anon, but let me know if other resources needed. I am happy to complete a letter for [employer] if needed. If there are concerns on your labs I will let you know - no med changes at this time.   Take care.   Keeping you healthy  Get these tests  Blood pressure- Have your blood pressure checked once a year by your healthcare provider.  Normal blood pressure is 120/80  Weight- Have your body mass index (BMI) calculated to screen for obesity.  BMI is a measure of body fat based on height and weight. You can also calculate your own BMI at [email or web address].[email or web address].  Cholesterol- Have your cholesterol checked every year.  Diabetes- Have your blood sugar checked regularly if you have high blood pressure, high cholesterol, have a family history of diabetes or if you are overweight.  Screening for Colon Cancer- Colonoscopy starting at age 39.  Screening may begin sooner depending on your family history and other health conditions. Follow up colonoscopy as directed by your  Gastroenterologist.  Screening for Prostate Cancer- Both blood work (PSA) and a rectal exam help screen for Prostate Cancer.  Screening begins at age 23 with African-American men and at age 29 with Caucasian men.  Screening may begin sooner depending on your family history.   Take these medicines  Flu shot- Every fall.  Tetanus- Every 10 years  Shingrix - shingles vaccine after age 79.   Pneumonia shot- Once after the age of 76; if you are younger than 88, ask your healthcare provider if you need a Pneumonia shot.  Take these steps  Don't smoke- If you do smoke, talk to your doctor about quitting.  For tips on how to quit, go to www.smokefree.gov or call 1-800-QUIT-NOW.  Be physically active- Exercise 5 days a week for at least 30 minutes.  If you are not already physically active start slow and gradually work up to 30 minutes of moderate physical activity.  Examples of moderate activity include walking briskly, mowing the yard, dancing, swimming, bicycling, etc.  Eat a healthy diet- Eat a variety of healthy food such as fruits, vegetables, low fat milk, low fat cheese, yogurt, lean meant, poultry, fish, beans, tofu, etc. For more information go to www.thenutritionsource.org  Drink alcohol in moderation- Limit alcohol intake to less than two drinks a day. Never drink and drive.  Dentist- Brush and floss twice daily; visit your dentist twice a year.  Depression- Your emotional health is as important as your physical health. If you're feeling down, or losing interest in things you would normally enjoy please talk to your healthcare provider.  Eye exam- Visit your eye doctor every year.  Safe sex- If you may be exposed to a sexually transmitted infection, use a condom.  Seat belts- Seat belts can save your life; always wear one.  Smoke/Carbon Monoxide detectors- These detectors need to be installed on the appropriate level of your home.  Replace batteries at least once a year.  Skin  cancer- When out in the sun, cover up and use sunscreen 15 SPF or higher.  Violence- If anyone is threatening you, please tell your healthcare provider.  Living Will/ Health care power of attorney- Speak with your healthcare provider and family.       Signed, [name], MD [facility]

## 2020-11-26 NOTE — Patient Instructions (Signed)
  Thanks for coming in today. Try Al-Anon, but let me know if other resources needed. I am happy to complete a letter for [employer] if needed. If there are concerns on your labs I will let you know - no med changes at this time.   Take care.   Keeping you healthy  Get these tests  Blood pressure- Have your blood pressure checked once a year by your healthcare provider.  Normal blood pressure is 120/80  Weight- Have your body mass index (BMI) calculated to screen for obesity.  BMI is a measure of body fat based on height and weight. You can also calculate your own BMI at ViewBanking.[email or web address].  Cholesterol- Have your cholesterol checked every year.  Diabetes- Have your blood sugar checked regularly if you have high blood pressure, high cholesterol, have a family history of diabetes or if you are overweight.  Screening for Colon Cancer- Colonoscopy starting at age 65.  Screening may begin sooner depending on your family history and other health conditions. Follow up colonoscopy as directed by your Gastroenterologist.  Screening for Prostate Cancer- Both blood work (PSA) and a rectal exam help screen for Prostate Cancer.  Screening begins at age 45 with African-American men and at age 49 with Caucasian men.  Screening may begin sooner depending on your family history.   Take these medicines  Flu shot- Every fall.  Tetanus- Every 10 years  Shingrix - shingles vaccine after age 59.   Pneumonia shot- Once after the age of 87; if you are younger than 93, ask your healthcare provider if you need a Pneumonia shot.  Take these steps  Don't smoke- If you do smoke, talk to your doctor about quitting.  For tips on how to quit, go to www.smokefree.gov or call 1-800-QUIT-NOW.  Be physically active- Exercise 5 days a week for at least 30 minutes.  If you are not already physically active start slow and gradually work up to 30 minutes of moderate physical activity.  Examples of  moderate activity include walking briskly, mowing the yard, dancing, swimming, bicycling, etc.  Eat a healthy diet- Eat a variety of healthy food such as fruits, vegetables, low fat milk, low fat cheese, yogurt, lean meant, poultry, fish, beans, tofu, etc. For more information go to www.thenutritionsource.org  Drink alcohol in moderation- Limit alcohol intake to less than two drinks a day. Never drink and drive.  Dentist- Brush and floss twice daily; visit your dentist twice a year.  Depression- Your emotional health is as important as your physical health. If you're feeling down, or losing interest in things you would normally enjoy please talk to your healthcare provider.  Eye exam- Visit your eye doctor every year.  Safe sex- If you may be exposed to a sexually transmitted infection, use a condom.  Seat belts- Seat belts can save your life; always wear one.  Smoke/Carbon Monoxide detectors- These detectors need to be installed on the appropriate level of your home.  Replace batteries at least once a year.  Skin cancer- When out in the sun, cover up and use sunscreen 15 SPF or higher.  Violence- If anyone is threatening you, please tell your healthcare provider.  Living Will/ Health care power of attorney- Speak with your healthcare provider and family.

## 2020-12-11 DIAGNOSIS — J301 Allergic rhinitis due to pollen: Secondary | ICD-10-CM | POA: Diagnosis not present

## 2020-12-11 DIAGNOSIS — J3089 Other allergic rhinitis: Secondary | ICD-10-CM | POA: Diagnosis not present

## 2020-12-11 DIAGNOSIS — L509 Urticaria, unspecified: Secondary | ICD-10-CM | POA: Diagnosis not present

## 2021-01-21 ENCOUNTER — Encounter: Payer: Self-pay | Admitting: Family Medicine

## 2021-01-26 ENCOUNTER — Encounter: Payer: Self-pay | Admitting: Family Medicine

## 2021-03-04 DIAGNOSIS — H6123 Impacted cerumen, bilateral: Secondary | ICD-10-CM | POA: Insufficient documentation

## 2021-03-16 DIAGNOSIS — H9313 Tinnitus, bilateral: Secondary | ICD-10-CM | POA: Diagnosis not present

## 2021-05-19 DIAGNOSIS — H5213 Myopia, bilateral: Secondary | ICD-10-CM | POA: Diagnosis not present

## 2021-08-18 DIAGNOSIS — L300 Nummular dermatitis: Secondary | ICD-10-CM | POA: Diagnosis not present

## 2021-12-07 ENCOUNTER — Other Ambulatory Visit: Payer: Self-pay

## 2021-12-07 ENCOUNTER — Ambulatory Visit: Payer: BC Managed Care – PPO | Admitting: Registered Nurse

## 2021-12-07 ENCOUNTER — Encounter: Payer: Self-pay | Admitting: Registered Nurse

## 2021-12-07 VITALS — BP 128/93 | HR 60 | Temp 98.2°F | Resp 18 | Ht 72.0 in | Wt 201.0 lb

## 2021-12-07 DIAGNOSIS — W57XXXA Bitten or stung by nonvenomous insect and other nonvenomous arthropods, initial encounter: Secondary | ICD-10-CM

## 2021-12-07 DIAGNOSIS — L509 Urticaria, unspecified: Secondary | ICD-10-CM

## 2021-12-07 DIAGNOSIS — S90569A Insect bite (nonvenomous), unspecified ankle, initial encounter: Secondary | ICD-10-CM | POA: Diagnosis not present

## 2021-12-07 MED ORDER — HYDROXYZINE HCL 10 MG PO TABS: 5.0000 mg | ORAL_TABLET | Freq: Three times a day (TID) | ORAL | 0 refills | Status: DC | PRN

## 2021-12-07 MED ORDER — PREDNISONE 10 MG (21) PO TBPK: ORAL_TABLET | ORAL | 0 refills | Status: DC

## 2021-12-07 NOTE — Patient Instructions (Signed)
Mr. Friedrich Harriott to meet you  Call with concerns  Prednisone - take until you run otu

## 2021-12-07 NOTE — Progress Notes (Signed)
Acute Office Visit  Subjective:    Patient ID: Stephen Obrien, male    DOB: 1966/03/12, 56 y.o.   MRN: [id number]  Chief Complaint  Patient presents with   Rash    Patient states he noticed something that looked like a bite on Sunday . Now he is rash all over    HPI Patient is in today for rash  Hx of allergic urticaria Similar in presentation Notes bite on his ankle on Sunday - after golf - feels likely it was spider or insect Itching, burning across lesions No drainage or sloughing No systemic issues.  Notes he is flying to [place] on Thursday afternoon and wanted to address this before then  Outpatient Medications Prior to Visit  Medication Sig Dispense Refill   atorvastatin (LIPITOR) 20 MG tablet Take 1 tablet (20 mg total) by mouth daily. 90 tablet 3   levocetirizine (XYZAL) 5 MG tablet Take 1 tablet (5 mg total) by mouth every evening. 30 tablet 11   Omalizumab (XOLAIR [place]) Inject 150 mg into the skin.      ascorbic acid (VITAMIN C) 100 MG tablet Take by mouth.     Cholecalciferol (VITAMIN D3) 10000 units capsule      Melatonin 5 MG CAPS Take by mouth.     No facility-administered medications prior to visit.    Review of Systems  Constitutional: Negative.   HENT: Negative.    Eyes: Negative.   Respiratory: Negative.    Cardiovascular: Negative.   Gastrointestinal: Negative.   Genitourinary: Negative.   Musculoskeletal: Negative.   Skin:  Positive for rash.  Neurological: Negative.   Psychiatric/Behavioral: Negative.        Objective:    BP (!) 128/93   Pulse 60   Temp 98.2 F (36.8 C) (Temporal)   Resp 18   Ht 6' (1.829 m)   Wt 201 lb (91.2 kg)   SpO2 100%   BMI 27.26 kg/m  Physical Exam Constitutional:      General: He is not in acute distress.    Appearance: Normal appearance. He is normal weight. He is not ill-appearing, toxic-appearing or diaphoretic.  Cardiovascular:     Rate and Rhythm: Normal rate and regular rhythm.     Heart sounds:  Normal heart sounds. No murmur heard.   No friction rub. No gallop.  Pulmonary:     Effort: Pulmonary effort is normal. No respiratory distress.     Breath sounds: Normal breath sounds. No stridor. No wheezing, rhonchi or rales.  Chest:     Chest wall: No tenderness.  Skin:    Findings: Rash (urticaria across body.) present.  Neurological:     General: No focal deficit present.     Mental Status: He is alert and oriented to person, place, and time. Mental status is at baseline.  Psychiatric:        Mood and Affect: Mood normal.        Behavior: Behavior normal.        Thought Content: Thought content normal.        Judgment: Judgment normal.    No results found for any visits on 12/07/21.      Assessment & Plan:  1. Urticaria - predniSONE (STERAPRED UNI-PAK 21 TAB) 10 MG (21) TBPK tablet; Take per package instructions. Do not skip doses. Finish entire supply.  Dispense: 1 each; Refill: 0 - hydrOXYzine (ATARAX) 10 MG tablet; Take 0.5-2.5 tablets (5-25 mg total) by mouth 3 (three) times daily as needed.  Dispense: 60 tablet; Refill: 0  2. Insect bite, initial encounter    Meds ordered this encounter  Medications   predniSONE (STERAPRED UNI-PAK 21 TAB) 10 MG (21) TBPK tablet    Sig: Take per package instructions. Do not skip doses. Finish entire supply.    Dispense:  1 each    Refill:  0    Order Specific Question:   Supervising Provider    Answer:   Carlota Raspberry, JEFFREY R [[id number]]   hydrOXYzine (ATARAX) 10 MG tablet    Sig: Take 0.5-2.5 tablets (5-25 mg total) by mouth 3 (three) times daily as needed.    Dispense:  60 tablet    Refill:  0    Order Specific Question:   Supervising Provider    Answer:   Carlota Raspberry, JEFFREY R [[id number]]    Return if symptoms worsen or fail to improve.  PLAN Typical urticaria appearance. Will send prednisone and hydroxyzine Advise triple abx ointment on bite area Call if worsening or failing to improve Patient encouraged to call clinic with any  questions, comments, or concerns.    Maximiano Coss, NP

## 2022-01-09 ENCOUNTER — Other Ambulatory Visit: Payer: Self-pay | Admitting: Family Medicine

## 2022-01-09 DIAGNOSIS — E785 Hyperlipidemia, unspecified: Secondary | ICD-10-CM

## 2022-01-10 DIAGNOSIS — J3089 Other allergic rhinitis: Secondary | ICD-10-CM | POA: Diagnosis not present

## 2022-01-10 DIAGNOSIS — J301 Allergic rhinitis due to pollen: Secondary | ICD-10-CM | POA: Diagnosis not present

## 2022-01-10 DIAGNOSIS — L509 Urticaria, unspecified: Secondary | ICD-10-CM | POA: Diagnosis not present

## 2022-02-02 ENCOUNTER — Encounter: Payer: Self-pay | Admitting: Family Medicine

## 2022-02-02 ENCOUNTER — Ambulatory Visit (INDEPENDENT_AMBULATORY_CARE_PROVIDER_SITE_OTHER): Payer: BC Managed Care – PPO | Admitting: Family Medicine

## 2022-02-02 VITALS — BP 120/66 | HR 66 | Temp 97.5°F | Resp 16 | Ht 72.0 in | Wt 202.2 lb

## 2022-02-02 DIAGNOSIS — Z0000 Encounter for general adult medical examination without abnormal findings: Secondary | ICD-10-CM | POA: Diagnosis not present

## 2022-02-02 DIAGNOSIS — Z131 Encounter for screening for diabetes mellitus: Secondary | ICD-10-CM

## 2022-02-02 DIAGNOSIS — Z125 Encounter for screening for malignant neoplasm of prostate: Secondary | ICD-10-CM

## 2022-02-02 DIAGNOSIS — Z130 Encounter for screening for diseases of the blood and blood-forming organs and certain disorders involving the immune mechanism: Secondary | ICD-10-CM | POA: Diagnosis not present

## 2022-02-02 DIAGNOSIS — E785 Hyperlipidemia, unspecified: Secondary | ICD-10-CM

## 2022-02-02 LAB — COMPREHENSIVE METABOLIC PANEL
ALT: 20 U/L (ref 0–53)
AST: 22 U/L (ref 0–37)
Albumin: 4.6 g/dL (ref 3.5–5.2)
Alkaline Phosphatase: 59 U/L (ref 39–117)
BUN: 14 mg/dL (ref 6–23)
CO2: 29 mEq/L (ref 19–32)
Calcium: 9.7 mg/dL (ref 8.4–10.5)
Chloride: 104 mEq/L (ref 96–112)
Creatinine, Ser: 0.99 mg/dL (ref 0.40–1.50)
GFR: 84.91 mL/min (ref >60.00)
Glucose, Bld: 91 mg/dL (ref 70–99)
Potassium: 4.3 mEq/L (ref 3.5–5.1)
Sodium: 140 mEq/L (ref 135–145)
Total Bilirubin: 0.9 mg/dL (ref 0.2–1.2)
Total Protein: 6.9 g/dL (ref 6.0–8.3)

## 2022-02-02 LAB — LIPID PANEL
Cholesterol: 145 mg/dL (ref 0–200)
HDL: 48.8 mg/dL (ref >39.00)
LDL Cholesterol: 82 mg/dL (ref 0–99)
NonHDL: 96.59
Total CHOL/HDL Ratio: 3
Triglycerides: 74 mg/dL (ref 0.0–149.0)
VLDL: 14.8 mg/dL (ref 0.0–40.0)

## 2022-02-02 LAB — CBC
HCT: 45.7 % (ref 39.0–52.0)
Hemoglobin: 15 g/dL (ref 13.0–17.0)
MCHC: 32.8 g/dL (ref 30.0–36.0)
MCV: 90.8 fl (ref 78.0–100.0)
Platelets: 228 10*3/uL (ref 150.0–400.0)
RBC: 5.03 Mil/uL (ref 4.22–5.81)
RDW: 13.3 % (ref 11.5–15.5)
WBC: 4.4 10*3/uL (ref 4.0–10.5)

## 2022-02-02 LAB — PSA: PSA: 0.48 ng/mL (ref 0.10–4.00)

## 2022-02-02 LAB — HEMOGLOBIN A1C: Hgb A1c MFr Bld: 5.9 % (ref 4.6–6.5)

## 2022-02-02 NOTE — Progress Notes (Signed)
Subjective:  Patient ID: Stephen Obrien, male    DOB: Dec 10, 1965  Age: 56 y.o. MRN: [id number]  CC:  Chief Complaint  Patient presents with   Annual Exam    Pt is fasting for labs     HPI [name] presents for Annual Exam  No change in health since last physical.  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  Letter completed last year. Term will start July next year, 2 year term.   Some right shoulder soreness after recent white water rafting, multiple trips back to back.  Soreness into the posterior shoulder, overall stable, improved.  No limitations in movement or strength.  Care team: PCP: me Allergist, Dr. [name], urticaria, allergic rhinitis treated with Despina Pole working well.  Audiology, [facility]. Tinnitus stable. Wearing hearing protection with mower. Hearing doing ok.   History of CPAP use for obstructive sleep apnea but mild AHI on follow-up testing.  Optional AutoPap but sleeping well off CPAP and melatonin has been used for sleep without difficulty.  Hyperlipidemia: Lipitor 20 mg daily. Some weight gain. Walking with golf, back on bike. No new side effects/myalgias.  Wt Readings from Last 3 Encounters:  02/02/22 202 lb 3.2 oz (91.7 kg)  12/07/21 201 lb (91.2 kg)  11/26/20 195 lb 6.4 oz (88.6 kg)    Lab Results  Component Value Date   CHOL 142 11/26/2020   HDL 44.40 11/26/2020   LDLCALC 84 11/26/2020   TRIG 70.0 11/26/2020   CHOLHDL 3 11/26/2020   Lab Results  Component Value Date   ALT 21 11/26/2020   AST 19 11/26/2020   ALKPHOS 56 11/26/2020   BILITOT 0.9 11/26/2020        02/02/2022   10:18 AM 12/07/2021   10:36 AM 11/26/2020    9:01 AM 11/22/2019    8:32 AM 06/17/2019    1:59 PM  Depression screen PHQ 2/9  Decreased Interest 0 0 0 0 0  Down, Depressed, Hopeless 0 0 0 0 0  PHQ - 2 Score 0 0 0 0 0  Altered sleeping  0     Tired, decreased energy  0     Change in appetite   0     Feeling bad or failure about yourself   0     Trouble concentrating  0     Moving slowly or fidgety/restless  0     Suicidal thoughts  0     PHQ-9 Score  0     Difficult doing work/chores  Not difficult at all       Health Maintenance  Topic Date Due   COVID-19 Vaccine (5 - Pfizer series) 02/18/2022 (Originally 07/29/2020)   INFLUENZA VACCINE  02/15/2022   COLONOSCOPY (Pts 45-43yr Insurance coverage will need to be confirmed)  01/20/2026   TETANUS/TDAP  11/07/2027   Hepatitis C Screening  Completed   HIV Screening  Completed   Zoster Vaccines- Shingrix  Completed   HPV VACCINES  Aged Out  Colonoscopy 01/21/2016, Dr. PHilarie Fredrickson  Diverticulosis, small internal hemorrhoids, otherwise normal.  Repeat 10 years.  Prostate: does NOT have family history of prostate cancer The natural history of prostate cancer and ongoing controversy regarding screening and potential treatment outcomes of prostate cancer has been discussed with the patient. The meaning of a false positive PSA and a false negative PSA has been discussed. He indicates understanding of the limitations of this screening test and wishes to proceed with screening PSA  testing. Lab Results  Component Value Date   PSA1 0.7 11/19/2019   PSA1 0.6 11/15/2018   PSA1 0.7 11/06/2017   PSA 0.52 11/26/2020   PSA 0.63 09/10/2015   PSA 0.69 09/09/2014      Immunization History  Administered Date(s) Administered   Influenza Inj Mdck Quad Pf 08/25/2018   Influenza, Seasonal, Injecte, Preservative Fre 06/12/2012   Influenza,inj,Quad PF,6+ Mos 09/06/2013, 09/09/2014, 09/10/2015, 04/01/2019, 05/27/2020   Influenza-Unspecified 05/30/2021   PFIZER(Purple Top)SARS-COV-2 Vaccination 09/20/2019, 10/25/2019, 06/03/2020   Tdap 04/24/2007, 11/06/2017   Zoster Recombinat (Shingrix) 03/30/2018, 08/25/2018  Covid infection 08/2021 - very mild sx's, prior booster was bivalent.  Considering repeat booster soon No results found. Optho in next few  months, yearly  Dental: every 6 months, appt next week.   Alcohol: 6-8 glasses wine per week.   Tobacco: none  Exercise:back on bike few days per week - 10 miles, resistance exercise few nights per week. Bands, yoga.    History Patient Active Problem List   Diagnosis Date Noted   OSA on CPAP 11/21/2018   Conductive hearing loss of left ear with unrestricted hearing of right ear 05/15/2017   Impacted cerumen of left ear 05/15/2017   Tinnitus of both ears 05/15/2017   Insomnia 09/10/2015   Allergic urticaria 09/06/2013   Other and unspecified hyperlipidemia 09/06/2013   Past Medical History:  Diagnosis Date   Allergy    Anxiety    Arthritis    Hyperlipidemia    OSA on CPAP 11/21/2018   Past Surgical History:  Procedure Laterality Date   DISTAL BICEPS TENDON REPAIR  06/2018   TEAR DUCT PROBING  1968   VASECTOMY     WISDOM TOOTH EXTRACTION  1983   No Known Allergies Prior to Admission medications   Medication Sig Start Date End Date Taking? Authorizing Provider  atorvastatin (LIPITOR) 20 MG tablet TAKE 1 TABLET(20 MG) BY MOUTH DAILY 01/10/22  Yes [name], MD  levocetirizine (XYZAL) 5 MG tablet Take 1 tablet (5 mg total) by mouth every evening. 09/10/15  Yes Daub, Loura Back, MD  Omalizumab [name] Derwood) Inject 150 mg into the skin.    Yes [provider]   Social History   Socioeconomic History   Marital status: Married    Spouse name: Not on file   Number of children: Not on file   Years of education: Not on file   Highest education level: Not on file  Occupational History   Not on file  Tobacco Use   Smoking status: Never   Smokeless tobacco: Never  Vaping Use   Vaping Use: Never used  Substance and Sexual Activity   Alcohol use: Yes    Alcohol/week: 4.0 standard drinks of alcohol    Types: 4 Standard drinks or equivalent per week    Comment: wine   Drug use: No   Sexual activity: Yes  Other Topics Concern   Not on file  Social History  Narrative   Not on file   Social Determinants of Health   Financial Resource Strain: Not on file  Food Insecurity: Not on file  Transportation Needs: Not on file  Physical Activity: Not on file  Stress: Not on file  Social Connections: Not on file  Intimate Partner Violence: Not on file    Review of Systems  13 point review of systems per patient health survey noted.  Negative other than as indicated above or in HPI.    Objective:   Vitals:  02/02/22 1015  BP: 120/66  Pulse: 66  Resp: 16  Temp: (!) 97.5 F (36.4 C)  TempSrc: Oral  SpO2: 98%  Weight: 202 lb 3.2 oz (91.7 kg)  Height: 6' (1.829 m)     Physical Exam Vitals reviewed.  Constitutional:      Appearance: He is well-developed.  HENT:     Head: Normocephalic and atraumatic.     Right Ear: External ear normal.     Left Ear: External ear normal.  Eyes:     Conjunctiva/sclera: Conjunctivae normal.     Pupils: Pupils are equal, round, and reactive to light.  Neck:     Thyroid: No thyromegaly.  Cardiovascular:     Rate and Rhythm: Normal rate and regular rhythm.     Heart sounds: Normal heart sounds.  Pulmonary:     Effort: Pulmonary effort is normal. No respiratory distress.     Breath sounds: Normal breath sounds. No wheezing.  Abdominal:     General: There is no distension.     Palpations: Abdomen is soft.     Tenderness: There is no abdominal tenderness.  Musculoskeletal:        General: No tenderness. Normal range of motion.     Cervical back: Normal range of motion and neck supple.     Comments: Right shoulder, no focal bony tenderness.  Minimal discomfort posterior deltoid, subacromial area but no AC, clavicle [place] tenderness.  Full range of motion.  Full rotator cuff strength.  Negative empty can, crossover, O'Brien.  Minimal discomfort with Hawkins, negative Neer's except slight discomfort at terminal flexion.  Neurovascular intact distally.  Lymphadenopathy:     Cervical: No cervical  adenopathy.  Skin:    General: Skin is warm and dry.  Neurological:     Mental Status: He is alert and oriented to person, place, and time.     Deep Tendon Reflexes: Reflexes are normal and symmetric.  Psychiatric:        Behavior: Behavior normal.        Assessment & Plan:  Traylon Schimming is a 56 y.o. male . Annual physical exam - Plan: Comprehensive metabolic panel, Lipid panel, Hemoglobin A1c, PSA, CBC  - -anticipatory guidance as below in AVS, screening labs above. Health maintenance items as above in HPI discussed/recommended as applicable.   -Possible overuse with tendinitis versus subacromial bursitis component of right shoulder pain.  Overall reassuring exam.  RTC precautions if persistent discomfort or worsening.  Can try over-the-counter NSAID temporarily for now.  Hyperlipidemia, unspecified hyperlipidemia type - Plan: Lipid panel, CBC  -Tolerating current dose statin, check updated labs.  Screening for diabetes mellitus (DM) - Plan: Hemoglobin A1c  Screening for prostate cancer - Plan: PSA  Screening, anemia, deficiency, iron - Plan: CBC   No orders of the defined types were placed in this encounter.  Patient Instructions  Shoulder pain may be a component of overuse, tendinitis or possible bursitis as we discussed.  Based on current exam I think it is reasonable to try over-the-counter Aleve or Advil temporarily, range of motion, and follow-up if not improving in the next few weeks.  Sooner if worse.  No medication changes today.  Thanks for coming in.  Let me know if there are questions and I will see you in a year for physical.  Preventive Care 26-78 Years Old, Male Preventive care refers to lifestyle choices and visits with your health care provider that can promote health and wellness. Preventive care visits are also called  wellness exams. What can I expect for my preventive care visit? Counseling During your preventive care visit, your health care provider may  ask about your: Medical history, including: Past medical problems. Family medical history. Current health, including: Emotional well-being. Home life and relationship well-being. Sexual activity. Lifestyle, including: Alcohol, nicotine or tobacco, and drug use. Access to firearms. Diet, exercise, and sleep habits. Safety issues such as seatbelt and bike helmet use. Sunscreen use. Work and work Statistician. Physical exam Your health care provider will check your: Height and weight. These may be used to calculate your BMI (body mass index). BMI is a measurement that tells if you are at a healthy weight. Waist circumference. This measures the distance around your waistline. This measurement also tells if you are at a healthy weight and may help predict your risk of certain diseases, such as type 2 diabetes and high blood pressure. Heart rate and blood pressure. Body temperature. Skin for abnormal spots. What immunizations do I need?  Vaccines are usually given at various ages, according to a schedule. Your health care provider will recommend vaccines for you based on your age, medical history, and lifestyle or other factors, such as travel or where you work. What tests do I need? Screening Your health care provider may recommend screening tests for certain conditions. This may include: Lipid and cholesterol levels. Diabetes screening. This is done by checking your blood sugar (glucose) after you have not eaten for a while (fasting). Hepatitis B test. Hepatitis C test. HIV (human immunodeficiency virus) test. STI (sexually transmitted infection) testing, if you are at risk. Lung cancer screening. Prostate cancer screening. Colorectal cancer screening. Talk with your health care provider about your test results, treatment options, and if necessary, the need for more tests. Follow these instructions at home: Eating and drinking  Eat a diet that includes fresh fruits and vegetables,  whole grains, lean protein, and low-fat dairy products. Take vitamin and mineral supplements as recommended by your health care provider. Do not drink alcohol if your health care provider tells you not to drink. If you drink alcohol: Limit how much you have to 0-2 drinks a day. Know how much alcohol is in your drink. In the [place] one drink equals one 12 oz bottle of beer (355 mL), one 5 oz glass of wine (148 mL), or one 1 oz glass of hard liquor (44 mL). Lifestyle Brush your teeth every morning and night with fluoride toothpaste. Floss one time each day. Exercise for at least 30 minutes 5 or more days each week. Do not use any products that contain nicotine or tobacco. These products include cigarettes, chewing tobacco, and vaping devices, such as e-cigarettes. If you need help quitting, ask your health care provider. Do not use drugs. If you are sexually active, practice safe sex. Use a condom or other form of protection to prevent STIs. Take aspirin only as told by your health care provider. Make sure that you understand how much to take and what form to take. Work with your health care provider to find out whether it is safe and beneficial for you to take aspirin daily. Find healthy ways to manage stress, such as: Meditation, yoga, or listening to music. Journaling. Talking to a trusted person. Spending time with friends and family. Minimize exposure to UV radiation to reduce your risk of skin cancer. Safety Always wear your seat belt while driving or riding in a vehicle. Do not drive: If you have been drinking alcohol. Do not ride  with someone who has been drinking. When you are tired or distracted. While texting. If you have been using any mind-altering substances or drugs. Wear a helmet and other protective equipment during sports activities. If you have firearms in your house, make sure you follow all gun safety procedures. What's next? Go to your health care provider once a year  for an annual wellness visit. Ask your health care provider how often you should have your eyes and teeth checked. Stay up to date on all vaccines. This information is not intended to replace advice given to you by your health care provider. Make sure you discuss any questions you have with your health care provider. Document Revised: 12/30/2020 Document Reviewed: 12/30/2020 Elsevier Patient Education  [place].   Shoulder Pain Many things can cause shoulder pain, including: An injury to the shoulder. Overuse of the shoulder. Arthritis. The source of the pain can be: Inflammation. An injury to the shoulder joint. An injury to a tendon, ligament, or bone. Follow these instructions at home: Pay attention to changes in your symptoms. Let your health care provider know about them. Follow these instructions to relieve your pain. If you have a sling: Wear the sling as told by your health care provider. Remove it only as told by your health care provider. Loosen the sling if your fingers tingle, become numb, or turn cold and blue. Keep the sling clean. If the sling is not waterproof: Do not let it get wet. Remove it to shower or bathe. Move your arm as little as possible, but keep your hand moving to prevent swelling. Managing pain, stiffness, and swelling  If directed, put ice on the painful area: Put ice in a plastic bag. Place a towel between your skin and the bag. Leave the ice on for 20 minutes, 2-3 times per day. Stop applying ice if it does not help with the pain. Squeeze a soft ball or a foam pad as much as possible. This helps to keep the shoulder from swelling. It also helps to strengthen the arm. General instructions Take over-the-counter and prescription medicines only as told by your health care provider. Keep all follow-up visits as told by your health care provider. This is important. Contact a health care provider if: Your pain gets worse. Your pain is not  relieved with medicines. New pain develops in your arm, hand, or fingers. Get help right away if: Your arm, hand, or fingers: Tingle. Become numb. Become swollen. Become painful. Turn white or blue. Summary Shoulder pain can be caused by an injury, overuse, or arthritis. Pay attention to changes in your symptoms. Let your health care provider know about them. This condition may be treated with a sling, ice, and pain medicines. Contact your health care provider if the pain gets worse or new pain develops. Get help right away if your arm, hand, or fingers tingle or become numb, swollen, or painful. Keep all follow-up visits as told by your health care provider. This is important. This information is not intended to replace advice given to you by your health care provider. Make sure you discuss any questions you have with your health care provider. Document Revised: 03/19/2021 Document Reviewed: 03/19/2021 Elsevier Patient Education  2023 [place],   [name], MD [place], [facility] 02/02/22 11:05 AM

## 2022-02-02 NOTE — Patient Instructions (Signed)
Shoulder pain may be a component of overuse, tendinitis or possible bursitis as we discussed.  Based on current exam I think it is reasonable to try over-the-counter Aleve or Advil temporarily, range of motion, and follow-up if not improving in the next few weeks.  Sooner if worse.  No medication changes today.  Thanks for coming in.  Let me know if there are questions and I will see you in a year for physical.  Preventive Care 48-57 Years Old, Male Preventive care refers to lifestyle choices and visits with your health care provider that can promote health and wellness. Preventive care visits are also called wellness exams. What can I expect for my preventive care visit? Counseling During your preventive care visit, your health care provider may ask about your: Medical history, including: Past medical problems. Family medical history. Current health, including: Emotional well-being. Home life and relationship well-being. Sexual activity. Lifestyle, including: Alcohol, nicotine or tobacco, and drug use. Access to firearms. Diet, exercise, and sleep habits. Safety issues such as seatbelt and bike helmet use. Sunscreen use. Work and work Statistician. Physical exam Your health care provider will check your: Height and weight. These may be used to calculate your BMI (body mass index). BMI is a measurement that tells if you are at a healthy weight. Waist circumference. This measures the distance around your waistline. This measurement also tells if you are at a healthy weight and may help predict your risk of certain diseases, such as type 2 diabetes and high blood pressure. Heart rate and blood pressure. Body temperature. Skin for abnormal spots. What immunizations do I need?  Vaccines are usually given at various ages, according to a schedule. Your health care provider will recommend vaccines for you based on your age, medical history, and lifestyle or other factors, such as travel or  where you work. What tests do I need? Screening Your health care provider may recommend screening tests for certain conditions. This may include: Lipid and cholesterol levels. Diabetes screening. This is done by checking your blood sugar (glucose) after you have not eaten for a while (fasting). Hepatitis B test. Hepatitis C test. HIV (human immunodeficiency virus) test. STI (sexually transmitted infection) testing, if you are at risk. Lung cancer screening. Prostate cancer screening. Colorectal cancer screening. Talk with your health care provider about your test results, treatment options, and if necessary, the need for more tests. Follow these instructions at home: Eating and drinking  Eat a diet that includes fresh fruits and vegetables, whole grains, lean protein, and low-fat dairy products. Take vitamin and mineral supplements as recommended by your health care provider. Do not drink alcohol if your health care provider tells you not to drink. If you drink alcohol: Limit how much you have to 0-2 drinks a day. Know how much alcohol is in your drink. In the [place] one drink equals one 12 oz bottle of beer (355 mL), one 5 oz glass of wine (148 mL), or one 1 oz glass of hard liquor (44 mL). Lifestyle Brush your teeth every morning and night with fluoride toothpaste. Floss one time each day. Exercise for at least 30 minutes 5 or more days each week. Do not use any products that contain nicotine or tobacco. These products include cigarettes, chewing tobacco, and vaping devices, such as e-cigarettes. If you need help quitting, ask your health care provider. Do not use drugs. If you are sexually active, practice safe sex. Use a condom or other form of protection to prevent STIs.  Take aspirin only as told by your health care provider. Make sure that you understand how much to take and what form to take. Work with your health care provider to find out whether it is safe and beneficial for you  to take aspirin daily. Find healthy ways to manage stress, such as: Meditation, yoga, or listening to music. Journaling. Talking to a trusted person. Spending time with friends and family. Minimize exposure to UV radiation to reduce your risk of skin cancer. Safety Always wear your seat belt while driving or riding in a vehicle. Do not drive: If you have been drinking alcohol. Do not ride with someone who has been drinking. When you are tired or distracted. While texting. If you have been using any mind-altering substances or drugs. Wear a helmet and other protective equipment during sports activities. If you have firearms in your house, make sure you follow all gun safety procedures. What's next? Go to your health care provider once a year for an annual wellness visit. Ask your health care provider how often you should have your eyes and teeth checked. Stay up to date on all vaccines. This information is not intended to replace advice given to you by your health care provider. Make sure you discuss any questions you have with your health care provider. Document Revised: 12/30/2020 Document Reviewed: 12/30/2020 Elsevier Patient Education  [place].   Shoulder Pain Many things can cause shoulder pain, including: An injury to the shoulder. Overuse of the shoulder. Arthritis. The source of the pain can be: Inflammation. An injury to the shoulder joint. An injury to a tendon, ligament, or bone. Follow these instructions at home: Pay attention to changes in your symptoms. Let your health care provider know about them. Follow these instructions to relieve your pain. If you have a sling: Wear the sling as told by your health care provider. Remove it only as told by your health care provider. Loosen the sling if your fingers tingle, become numb, or turn cold and blue. Keep the sling clean. If the sling is not waterproof: Do not let it get wet. Remove it to shower or  bathe. Move your arm as little as possible, but keep your hand moving to prevent swelling. Managing pain, stiffness, and swelling  If directed, put ice on the painful area: Put ice in a plastic bag. Place a towel between your skin and the bag. Leave the ice on for 20 minutes, 2-3 times per day. Stop applying ice if it does not help with the pain. Squeeze a soft ball or a foam pad as much as possible. This helps to keep the shoulder from swelling. It also helps to strengthen the arm. General instructions Take over-the-counter and prescription medicines only as told by your health care provider. Keep all follow-up visits as told by your health care provider. This is important. Contact a health care provider if: Your pain gets worse. Your pain is not relieved with medicines. New pain develops in your arm, hand, or fingers. Get help right away if: Your arm, hand, or fingers: Tingle. Become numb. Become swollen. Become painful. Turn white or blue. Summary Shoulder pain can be caused by an injury, overuse, or arthritis. Pay attention to changes in your symptoms. Let your health care provider know about them. This condition may be treated with a sling, ice, and pain medicines. Contact your health care provider if the pain gets worse or new pain develops. Get help right away if your arm, hand,  or fingers tingle or become numb, swollen, or painful. Keep all follow-up visits as told by your health care provider. This is important. This information is not intended to replace advice given to you by your health care provider. Make sure you discuss any questions you have with your health care provider. Document Revised: 03/19/2021 Document Reviewed: 03/19/2021 Elsevier Patient Education  [place].

## 2022-02-07 DIAGNOSIS — L570 Actinic keratosis: Secondary | ICD-10-CM | POA: Diagnosis not present

## 2022-02-07 DIAGNOSIS — L300 Nummular dermatitis: Secondary | ICD-10-CM | POA: Diagnosis not present

## 2022-02-07 DIAGNOSIS — L218 Other seborrheic dermatitis: Secondary | ICD-10-CM | POA: Diagnosis not present

## 2022-02-07 DIAGNOSIS — L720 Epidermal cyst: Secondary | ICD-10-CM | POA: Diagnosis not present

## 2022-05-19 DIAGNOSIS — H524 Presbyopia: Secondary | ICD-10-CM | POA: Diagnosis not present

## 2022-07-13 ENCOUNTER — Telehealth: Payer: Self-pay | Admitting: *Deleted

## 2022-07-13 NOTE — Patient Outreach (Signed)
  Care Coordination   07/13/2022 Name: Stephen Obrien MRN: [id number] DOB: Apr 04, 1966   Care Coordination Outreach Attempts:  An unsuccessful telephone outreach was attempted today to offer the patient information about available care coordination services as a benefit of their health plan.   Follow Up Plan:  Additional outreach attempts will be made to offer the patient care coordination information and services.   Encounter Outcome:  No Answer   Care Coordination Interventions:  No, not indicated    Raina Mina, RN Care Management Coordinator [place] Office [phone]

## 2022-07-20 MED ORDER — XOLAIR 150 MG/ML SUBCUTANEOUS SYRINGE
SUBCUTANEOUS | 6 refills | 28 days
Start: 2022-07-20 — End: ?

## 2022-07-28 DIAGNOSIS — L501 Idiopathic urticaria: Principal | ICD-10-CM

## 2022-07-30 NOTE — Unmapped (Signed)
[facility] Specialty Medication Onboarding    Specialty Medication: Xolair  Prior Authorization: Approved   Financial Assistance: Yes - copay card approved as secondary   Final Copay/Day Supply: $0 / 28    Insurance Restrictions: None     Notes to Pharmacist: None    The triage team has completed the benefits investigation and has determined that the patient is able to fill this medication at [facility]. Please contact the patient to complete the onboarding or follow up with the prescribing physician as needed.

## 2022-08-03 NOTE — Unmapped (Addendum)
[facility]   Patient Onboarding/Medication Counseling    Peter Santiago is a 57 y.o. male with idiopathic urticaria who I am counseling today on continuation of therapy.  I am speaking to the patient.    Was a Nurse, learning disability used for this call? No    Verified patient's date of birth / HIPAA.    Specialty medication(s) to be sent: Inflammatory Disorders: Xolair      Non-specialty medications/supplies to be sent: n/a      Medications not needed at this time: n/a       The patient declined counseling on medication administration, missed dose instructions, goals of therapy, side effects and monitoring parameters, warnings and precautions, drug/food interactions, and storage, handling precautions, and disposal because they have taken the medication previously. The information in the declined sections below are for informational purposes only and was not discussed with patient.       Xolair Syringes (omalizumab)    Medication & Administration     Dosage: Inject 300 mg under the skin every 28 days    Administration:     Vials: Must be administered as a subcutaneous injection in the abdomen or thighs by a healthcare professional. Patient will be observed after receiving first 3 injections in clinic before moving to home administration.    Prefilled Syringe:  Home administration screening questions:    Has patient ever had an anaphylaxis reaction to Xolair or other agents, such as foods, drugs, biologics, etc? No    Has patient received at least 3 doses in clinic under the supervision of a healthcare proefssional? Yes    Does the patient have an Epi-pen to use for possible anaphylaxis reaction? Yes    Injection administration:   Gather all supplies needed for injection on a clean, flat working surface: medication syringe(s) removed from packaging, alcohol swab, sharps container, etc.  Look at the medication label - look for correct medication, correct dose, and check the expiration date  Look at the medication - the liquid in the syringe should appear clear and colorless to slightly yellow, you may see a few white particles  Lay the syringe on a flat surface and allow it to warm up to room temperature for at least 15-30 minutes  Select injection site - you can use the front of your thigh or your belly (but not the area 2 inches around your belly button); if someone else is giving you the injection you can also use your upper arm in the skin covering your triceps muscle or in the buttocks  Prepare injection site - wash your hands and clean the skin at the injection site with an alcohol swab and let it air dry, do not touch the injection site again before the injection  Pull off the needle safety cap, do not remove until immediately prior to injection  Pinch the skin - with your hand not holding the syringe pinch up a fold of skin at the injection site using your forefinger and thumb  Insert the needle into the fold of skin at about a 45 degree angle - it's best to use a quick dart-like motion  Push the plunger down slowly as far as it will go until the syringe is empty, if the plunger is not fully depressed the needle shield will not extend to cover the needle when it is removed, hold the syringe in place for a full 5 seconds  Check that the syringe is empty and keep pressing down on the  plunger while you pull the needle out at the same angle as inserted; after the needle is removed completely from the skin, release the plunger allowing the needle shield to activate and cover the used needle  Dispose of the used syringe immediately in your sharps disposal container, do not attempt to recap the needle prior to disposing  If you see any blood at the injection site, press a cotton ball or gauze on the site and maintain pressure until the bleeding stops, do not rub the injection site    Adherence/Missed dose instructions: If you miss a dose take as soon as you remember.  Resume the correct dosing schedule.        Goals of Therapy     To treat asthma and or chronic idiopathic urticaria    Side Effects & Monitoring Parameters     Commonly reported side effects  Headache  Nausea, vomiting   Injection site reaction  Loss of strength and energy  Common cold symptoms, sore throat, stuffy nose  Ear pain  Painful extremities     The following side effects should be reported to the provider:  Signs of cerebrovascular disease (change in strength on one side is greater than the other, trouble speaking or thinking, change in balance or vision changes)  Signs of DVT (swelling, warmth, numbness, change in color or pain in extremities)  Signs of anaphylaxis (wheezing, chest tightness, swelling of face, lips, tongue or throat)    Monitoring Parameters:   Anaphylactic/hypersensitivity reactions (observe patients for 2 hours after the first 3 injections and 30 minutes after subsequent injections or in accordance with individual institution policies and procedures);   Baseline serum total IgE; FEV1, peak flow, and/or other pulmonary function tests  Monitor for signs of infection    Contraindications, Warnings, & Precautions   [place] Boxed Warning]: Anaphylaxis, including delayed-onset anaphylaxis, has been reported following administration; anaphylaxis may present as bronchospasm, hypotension, syncope, urticaria, and/or angioedema of the throat or tongue. Anaphylaxis has occurred after the first dose and in some cases >1 year after initiation of regular treatment. Due to the risk, patients should be observed closely for an appropriate time period after administration and should receive treatment only under direct medical supervision. Healthcare providers should be prepared to administer appropriate therapy for managing potentially life-threatening anaphylaxis. Patients should be instructed on identifying signs/symptoms of anaphylaxis and to seek immediate care if they arise.      Contraindications  Severe hypersensitivity reaction to omalizumab or any component of the formulation    Warnings & Precautions  Cardiovascular effects: Cerebrovascular events, including transient ischemic attack and ischemic stroke, have been reported.  Eosinophilia and vasculitis: In rare cases, patients may present with systemic eosinophilia, sometimes presenting with clinical features of vasculitis.    Fever/arthralgia/rash: Reports of a constellation of symptoms including fever, arthritis or arthralgia, rash, and lymphadenopathy have been reported with post-marketing use.  Malignant neoplasms: Have been reported rarely with use in short-term studies; impact of long-term use is not known.  Parasitic infections: Use with caution and monitor patients at high risk for parasitic infections; risk of infection may be increased; appropriate duration of continued monitoring following therapy discontinuation has not been established.  Corticosteroid therapy: Gradually taper systemic or inhaled corticosteroid therapy; do not discontinue corticosteroids abruptly following initiation of omalizumab therapy. The combined use of omalizumab and corticosteroids in patients with chronic idiopathic urticaria has not been evaluated.  Latex: Prefilled syringe: The needle cap may contain natural rubber latex.  Appropriate use:  Therapy has not been shown to alleviate acute asthma exacerbations; do not use to treat acute bronchospasm, status asthmaticus, or other allergic conditions. Do not use to treat forms of urticaria other than chronic idiopathic urticaria.  Dosing/IgE levels: Dosing for asthma is based on body weight and pretreatment total IgE serum levels. IgE levels remain elevated up to 1 year following treatment; therefore, levels taken during treatment or for up to 1 year following treatment cannot and should not be used as a dosage guide. Dosing in chronic idiopathic urticaria is not dependent on serum IgE (free or total) level or body weight.  Pregnancy Considerations: Omalizumab is a humanized monoclonal antibody (IgG1). Potential placental transfer of human IgG is dependent upon the IgG subclass and gestational age, generally increasing as pregnancy progresses.  Breastfeeding Considerations: It is not known if omalizumab is present in breast milk; however, IgG is excreted in human milk.  Based on information from the pregnancy exposure registry, an increased risk of adverse events was not observed in breastfed infants of mothers using omalizumab. According to the manufacturer, the decision to breastfeed during therapy should consider the risk of infant exposure, the benefits of breastfeeding to the infant, and benefits of treatment to the mother      Drug/Food Interactions     Medication list reviewed in Epic. The patient was instructed to inform the care team before taking any new medications or supplements. No drug interactions identified.     Storage, Handling Precautions, & Disposal     Store this medication in the refrigerator, 2??C to 8??C (36??F to 46??F). in the original carton.  Protect from direct sunlight and do not freeze. Must be used within 4 hours after removal from refrigerator.  Do not shake.  Dispose of used syringes in a sharps disposal container.      Current Medications (including OTC/herbals), Comorbidities and Allergies     Current Outpatient Medications   Medication Sig Dispense Refill    atorvastatin (LIPITOR) 20 MG tablet Take 1 tablet (20 mg total) by mouth daily.      levocetirizine (XYZAL) 5 MG tablet Take 1 tablet (5 mg total) by mouth every evening.      lutein 10 mg Tab Take 1 tablet (10 mg total) by mouth daily.      omalizumab (XOLAIR) 150 mg/mL syringe Inject the contents of 2 syringes (300 mg total) under the skin every twenty-eight (28) days. 2 mL 6     No current facility-administered medications for this visit.       No Known Allergies    There is no problem list on file for this patient.      Reviewed and up to date in Epic.    Appropriateness of Therapy     Acute infections noted within Epic:  No active infections  Patient reported infection: None    Is medication and dose appropriate based on diagnosis and infection status? Yes    Prescription has been clinically reviewed: Yes      Baseline Quality of Life Assessment      How many days over the past month did your idiopathic urticaria  keep you from your normal activities? For example, brushing your teeth or getting up in the morning. 0    Financial Information     Medication Assistance provided: Prior Authorization and Copay Assistance    Anticipated copay of $0 / 28 days reviewed with patient. Verified delivery address.    Delivery Information     Scheduled delivery  date: 08/18/22    Expected start date: Continuation of therapy - Mr. Shawn states he took last dose over the weekend and has taken Xolair for the last 10 years.    Medication will be delivered via UPS to the prescription address in [facility].  This shipment will not require a signature.      Explained the services we provide at [facility] and that each month we would call to set up refills.  Stressed importance of returning phone calls so that we could ensure they receive their medications in time each month.  Informed patient that we should be setting up refills 7-10 days prior to when they will run out of medication.  A pharmacist will reach out to perform a clinical assessment periodically.  Informed patient that a welcome packet, containing information about our pharmacy and other support services, a Notice of Privacy Practices, and a drug information handout will be sent.      The patient or caregiver noted above participated in the development of this care plan and knows that they can request review of or adjustments to the care plan at any time.      Patient or caregiver verbalized understanding of the above information as well as how to contact the pharmacy at [phone] option 4 with any questions/concerns.  The pharmacy is open Monday through Friday 8:30am-4:30pm.  A pharmacist is available 24/7 via pager to answer any clinical questions they may have.    Patient Specific Needs     Does the patient have any physical, cognitive, or cultural barriers? No    Does the patient have adequate living arrangements? (i.e. the ability to store and take their medication appropriately) Yes    Did you identify any home environmental safety or security hazards? No    Patient prefers to have medications discussed with  Patient     Is the patient or caregiver able to read and understand education materials at a high school level or above? Yes    Patient's primary language is  English     Is the patient high risk? No    SOCIAL DETERMINANTS OF HEALTH     At the [facility], we have learned that life circumstances - like trouble affording food, housing, utilities, or transportation can affect the health of many of our patients.   That is why we wanted to ask: are you currently experiencing any life circumstances that are negatively impacting your health and/or quality of life? Patient declined to answer    Social Determinants of Health     Financial Resource Strain: Not on file   Internet Connectivity: Not on file   Food Insecurity: Not on file   Tobacco Use: Not on file (10/16/2012)   Housing/Utilities: Not on file   Alcohol Use: Not on file   Transportation Needs: Not on file   Substance Use: Not on file   Health Literacy: Not on file   Physical Activity: Not on file   Interpersonal Safety: Not on file   Stress: Not on file   Intimate Partner Violence: Not on file   Depression: Not on file   Social Connections: Not on file       Would you be willing to receive help with any of the needs that you have identified today? Not applicable       Oliva Bustard, PharmD  [facility] Specialty Pharmacist

## 2022-08-12 DIAGNOSIS — F4320 Adjustment disorder, unspecified: Secondary | ICD-10-CM | POA: Diagnosis not present

## 2022-08-17 DIAGNOSIS — F4320 Adjustment disorder, unspecified: Secondary | ICD-10-CM | POA: Diagnosis not present

## 2022-08-17 MED FILL — XOLAIR 150 MG/ML SUBCUTANEOUS SYRINGE: SUBCUTANEOUS | 28 days supply | Qty: 2 | Fill #0

## 2022-09-01 DIAGNOSIS — F4320 Adjustment disorder, unspecified: Secondary | ICD-10-CM | POA: Diagnosis not present

## 2022-09-07 NOTE — Unmapped (Signed)
[facility] Pharmacy Refill Coordination Note    Peter Santiago, DOB: 1965-10-12  Phone: [phone] (home)       All above HIPAA information was verified with patient.         09/06/2022     6:41 PM   Specialty Rx Medication Refill Questionnaire   Which Medications would you like refilled and shipped? Xolair 150mg    Please list all current allergies: only environmental - weeds, grasses, etc.   Have you missed any doses in the last 30 days? No   Have you had any changes to your medication(s) since your last refill? No   How many days remaining of each medication do you have at home? 28 (one set of two syringes)   If receiving an injectable medication, next injection date is 09/11/2022   Have you experienced any side effects in the last 30 days? No   Please enter the full address (street address, city, state, zip code) where you would like your medication(s) to be delivered to. [address] [place], [name]  [address]   Please specify on which day you would like your medication(s) to arrive. Note: if you need your medication(s) within 3 days, please call the pharmacy to schedule your order at [phone]  09/13/2022   Has your insurance changed since your last refill? No   Would you like a pharmacist to call you to discuss your medication(s)? No   Do you require a signature for your package? (Note: if we are billing Medicare Part B or your order contains a controlled substance, we will require a signature) No         Completed refill call assessment today to schedule patient's medication shipment from the [facility] [phone]).  All relevant notes have been reviewed.       Confirmed patient received a Conservation officer, historic buildings and a Surveyor, mining with first shipment. The patient will receive a drug information handout for each medication shipped and additional FDA Medication Guides as required.         REFERRAL TO PHARMACIST     Referral to the pharmacist: Not needed      [facility] Shipping address confirmed in Epic.     Delivery Scheduled: Yes, Expected medication delivery date: 09/13/22.     Medication will be delivered via UPS to the prescription address in Epic WAM.    Elfreda Blanchet' W Danae Chen Shared [facility] Specialty Technician

## 2022-09-09 DIAGNOSIS — F4320 Adjustment disorder, unspecified: Secondary | ICD-10-CM | POA: Diagnosis not present

## 2022-09-12 MED FILL — XOLAIR 150 MG/ML SUBCUTANEOUS SYRINGE: SUBCUTANEOUS | 28 days supply | Qty: 2 | Fill #1

## 2022-09-14 DIAGNOSIS — F4320 Adjustment disorder, unspecified: Secondary | ICD-10-CM | POA: Diagnosis not present

## 2022-09-21 DIAGNOSIS — F4320 Adjustment disorder, unspecified: Secondary | ICD-10-CM | POA: Diagnosis not present

## 2022-09-28 DIAGNOSIS — F4320 Adjustment disorder, unspecified: Secondary | ICD-10-CM | POA: Diagnosis not present

## 2022-10-05 NOTE — Unmapped (Signed)
[facility] Pharm[facility] Refill Coordination Note    Peter Santiago, DOB: 1966-04-09  Phone: [phone] (home)       All above HIPAA information was verified with patient.         10/04/2022     9:34 AM   Specialty Rx Medication Refill Questionnaire   Which Medications would you like refilled and shipped? Xolair   Please list all current allergies: seasonal - grass and weeds   Have you missed any doses in the last 30 days? No   Have you had any changes to your medication(s) since your last refill? No   How many days remaining of each medication do you have at home? 1 refill   If receiving an injectable medication, next injection date is 10/20/2022   Have you experienced any side effects in the last 30 days? No   Please enter the full address (street address, city, state, zip code) where you would like your medication(s) to be delivered to. [address], [place], [name]  [address]   Please specify on which day you would like your medication(s) to arrive. Note: if you need your medication(s) within 3 days, please call the pharmacy to schedule your order at [phone]  10/12/2022   Has your insurance changed since your last refill? No   Would you like a pharmacist to call you to discuss your medication(s)? No   Do you require a signature for your package? (Note: if we are billing Medicare Part B or your order contains a controlled substance, we will require a signature) No         Completed refill call assessment today to schedule patient's medication shipment from the [facility] [phone]).  All relevant notes have been reviewed.       Confirmed patient received a Conservation officer, historic buildings and a Surveyor, mining with first shipment. The patient will receive a drug information handout for each medication shipped and additional FDA Medication Guides as required.         REFERRAL TO PHARMACIST     Referral to the pharmacist: Not needed      [facility]     Shipping address confirmed in Epic. Delivery Scheduled: Yes, Expected medication delivery date: 10/12/22.     Medication will be delivered via UPS to the prescription address in Epic WAM.    Peter Santiago' [facility] Specialty Technician

## 2022-10-11 MED FILL — XOLAIR 150 MG/ML SUBCUTANEOUS SYRINGE: SUBCUTANEOUS | 28 days supply | Qty: 2 | Fill #2

## 2022-10-12 DIAGNOSIS — F4320 Adjustment disorder, unspecified: Secondary | ICD-10-CM | POA: Diagnosis not present

## 2022-10-18 DIAGNOSIS — F4320 Adjustment disorder, unspecified: Secondary | ICD-10-CM | POA: Diagnosis not present

## 2022-10-30 DIAGNOSIS — M542 Cervicalgia: Secondary | ICD-10-CM | POA: Diagnosis not present

## 2022-10-30 DIAGNOSIS — R21 Rash and other nonspecific skin eruption: Secondary | ICD-10-CM | POA: Diagnosis not present

## 2022-11-02 DIAGNOSIS — F4320 Adjustment disorder, unspecified: Secondary | ICD-10-CM | POA: Diagnosis not present

## 2022-11-02 MED ORDER — ALCOHOL SWABS
11 refills | 0 days
Start: 2022-11-02 — End: ?

## 2022-11-02 NOTE — Unmapped (Signed)
[facility] Pharm[facility] Refill Coordination Note    Peter Santiago, DOB: 1965/09/07  Phone: [phone] (home)       All above HIPAA information was verified with patient.         11/01/2022     9:01 PM   Specialty Rx Medication Refill Questionnaire   Which Medications would you like refilled and shipped? Xolair   If medication refills are not needed at this time, please indicate the reason below. none   Please list all current allergies: seasonal weeds and grasses   Have you missed any doses in the last 30 days? No   Have you had any changes to your medication(s) since your last refill? No   How many days remaining of each medication do you have at home? 1 set of injections remaining   If receiving an injectable medication, next injection date is 11/15/2022   Have you experienced any side effects in the last 30 days? No   Please enter the full address (street address, city, state, zip code) where you would like your medication(s) to be delivered to. [address], [place], [name]  [address]   Please specify on which day you would like your medication(s) to arrive. Note: if you need your medication(s) within 3 days, please call the pharmacy to schedule your order at [phone]  11/08/2022   Has your insurance changed since your last refill? No   Would you like a pharmacist to call you to discuss your medication(s)? No   Do you require a signature for your package? (Note: if we are billing Medicare Part B or your order contains a controlled substance, we will require a signature) No   Additional Comments: please include (2) alcohol pads with Xolair         Completed refill call assessment today to schedule patient's medication shipment from the [facility] [phone]).  All relevant notes have been reviewed.       Confirmed patient received a Conservation officer, historic buildings and a Surveyor, mining with first shipment. The patient will receive a drug information handout for each medication shipped and additional FDA Medication Guides as required.         REFERRAL TO PHARMACIST     Referral to the pharmacist: Not needed      [facility]     Shipping address confirmed in Epic.     Delivery Scheduled: Yes, Expected medication delivery date: 11/08/22.     Medication will be delivered via UPS to the prescription address in Epic WAM.    Marcus Schwandt' W Danae Chen Shared [facility] Specialty Technician

## 2022-11-08 MED FILL — ALCOHOL PREP PADS: 100 days supply | Qty: 100 | Fill #0

## 2022-11-08 MED FILL — XOLAIR 150 MG/ML SUBCUTANEOUS SYRINGE: SUBCUTANEOUS | 28 days supply | Qty: 2 | Fill #3

## 2022-12-15 NOTE — Unmapped (Signed)
[facility] Refill Coordination Note    Specialty Medication(s) to be Shipped:   Inflammatory Disorders: Xolair    Other medication(s) to be shipped: No additional medications requested for fill at this time     Peter Santiago, DOB: Dec 23, 1965  Phone: [phone] (home)       All above HIPAA information was verified with patient.     Was a Nurse, learning disability used for this call? No    Completed refill call assessment today to schedule patient's medication shipment from the [facility] [phone]).  All relevant notes have been reviewed.     Specialty medication(s) and dose(s) confirmed: Regimen is correct and unchanged.   Changes to medications: [name] reports no changes at this time.  Changes to insurance: No  New side effects reported not previously addressed with a pharmacist or physician: None reported  Questions for the pharmacist: No    Confirmed patient received a Conservation officer, historic buildings and a Surveyor, mining with first shipment. The patient will receive a drug information handout for each medication shipped and additional FDA Medication Guides as required.       DISEASE/MEDICATION-SPECIFIC INFORMATION        For patients on injectable medications: Patient currently has 0 doses left.  Next injection is scheduled for 8/6.    SPECIALTY MEDICATION ADHERENCE     Medication Adherence    Patient reported X missed doses in the last month: 0  Specialty Medication: XOLAIR 150 mg/mL syringe (omalizumab)  Patient is on additional specialty medications: No  Patient is on more than two specialty medications: No  Any gaps in refill history greater than 2 weeks in the last 3 months: no  Demonstrates understanding of importance of adherence: yes  Informant: patient          Were doses missed due to medication being on hold?  No - missed dose due to international travel. Denies any breakthrough hives with missed dose. Last dose given 7/9    Xolair 150 mg/ml: 0 days of medicine on hand REFERRAL TO PHARMACIST     Referral to the pharmacist: Not needed      [facility]     Shipping address confirmed in Epic.       Delivery Scheduled: Yes, Expected medication delivery date: 02/02/23.     Medication will be delivered via UPS to the prescription address in Epic WAM.    Oliva Bustard, PharmD   [facility] Specialty Pharmacist

## 2023-02-01 MED FILL — XOLAIR 150 MG/ML SUBCUTANEOUS SYRINGE: SUBCUTANEOUS | 28 days supply | Qty: 2 | Fill #4

## 2023-02-08 ENCOUNTER — Encounter: Payer: BC Managed Care – PPO | Admitting: Family Medicine

## 2023-02-14 ENCOUNTER — Other Ambulatory Visit: Payer: Self-pay | Admitting: Family Medicine

## 2023-02-14 DIAGNOSIS — E785 Hyperlipidemia, unspecified: Secondary | ICD-10-CM

## 2023-02-21 NOTE — Unmapped (Signed)
[facility] Refill Coordination Note    Peter Santiago, DOB: [birth year]  Phone: [phone] (home)       All above HIPAA information was verified with patient.         02/21/2023     9:25 AM   Specialty Rx Medication Refill Questionnaire   Which Medications would you like refilled and shipped? Xolair   Please list all current allergies: none   Have you missed any doses in the last 30 days? No   Have you had any changes to your medication(s) since your last refill? No   How many days remaining of each medication do you have at home? 28 (1 dose)   If receiving an injectable medication, next injection date is 03/19/2023   Have you experienced any side effects in the last 30 days? No   Please enter the full address (street address, city, state, zip code) where you would like your medication(s) to be delivered to. [address], [place], [name]  [address], [place]   Please specify on which day you would like your medication(s) to arrive. Note: if you need your medication(s) within 3 days, please call the pharmacy to schedule your order at [phone]  02/28/2023   Has your insurance changed since your last refill? No   Would you like a pharmacist to call you to discuss your medication(s)? No   Do you require a signature for your package? (Note: if we are billing Medicare Part B or your order contains a controlled substance, we will require a signature) No         Completed refill call assessment today to schedule patient's medication shipment from the [facility] [phone]).  All relevant notes have been reviewed.       Confirmed patient received a Conservation officer, historic buildings and a Surveyor, mining with first shipment. The patient will receive a drug information handout for each medication shipped and additional FDA Medication Guides as required.         REFERRAL TO PHARMACIST     Referral to the pharmacist: Not needed      [facility]     Shipping address confirmed in Epic.     Delivery Scheduled: Yes, Expected medication delivery date: 02/28/23.     Medication will be delivered via UPS to the prescription address in Epic WAM.    Shamond Skelton' [facility] Specialty Technician

## 2023-02-22 ENCOUNTER — Ambulatory Visit: Payer: BC Managed Care – PPO | Admitting: Family Medicine

## 2023-02-22 ENCOUNTER — Encounter: Payer: Self-pay | Admitting: Family Medicine

## 2023-02-22 VITALS — BP 118/76 | HR 67 | Temp 98.2°F | Ht 72.0 in | Wt 200.4 lb

## 2023-02-22 DIAGNOSIS — Z130 Encounter for screening for diseases of the blood and blood-forming organs and certain disorders involving the immune mechanism: Secondary | ICD-10-CM | POA: Diagnosis not present

## 2023-02-22 DIAGNOSIS — Z125 Encounter for screening for malignant neoplasm of prostate: Secondary | ICD-10-CM

## 2023-02-22 DIAGNOSIS — E785 Hyperlipidemia, unspecified: Secondary | ICD-10-CM | POA: Diagnosis not present

## 2023-02-22 DIAGNOSIS — Z131 Encounter for screening for diabetes mellitus: Secondary | ICD-10-CM | POA: Diagnosis not present

## 2023-02-22 DIAGNOSIS — Z0000 Encounter for general adult medical examination without abnormal findings: Secondary | ICD-10-CM | POA: Diagnosis not present

## 2023-02-22 LAB — CBC WITH DIFFERENTIAL/PLATELET
Basophils Absolute: 0 10*3/uL (ref 0.0–0.1)
Basophils Relative: 0.4 % (ref 0.0–3.0)
Eosinophils Absolute: 0.1 10*3/uL (ref 0.0–0.7)
Eosinophils Relative: 2.5 % (ref 0.0–5.0)
HCT: 45.9 % (ref 39.0–52.0)
Hemoglobin: 14.8 g/dL (ref 13.0–17.0)
Lymphocytes Relative: 38.1 % (ref 12.0–46.0)
Lymphs Abs: 1.7 10*3/uL (ref 0.7–4.0)
MCHC: 32.4 g/dL (ref 30.0–36.0)
MCV: 91 fl (ref 78.0–100.0)
Monocytes Absolute: 0.6 10*3/uL (ref 0.1–1.0)
Monocytes Relative: 13.9 % — ABNORMAL HIGH (ref 3.0–12.0)
Neutro Abs: 2 10*3/uL (ref 1.4–7.7)
Neutrophils Relative %: 45.1 % (ref 43.0–77.0)
Platelets: 251 10*3/uL (ref 150.0–400.0)
RBC: 5.04 Mil/uL (ref 4.22–5.81)
RDW: 13.7 % (ref 11.5–15.5)
WBC: 4.4 10*3/uL (ref 4.0–10.5)

## 2023-02-22 LAB — COMPREHENSIVE METABOLIC PANEL
ALT: 20 U/L (ref 0–53)
AST: 19 U/L (ref 0–37)
Albumin: 4.5 g/dL (ref 3.5–5.2)
Alkaline Phosphatase: 63 U/L (ref 39–117)
BUN: 15 mg/dL (ref 6–23)
CO2: 30 mEq/L (ref 19–32)
Calcium: 9.7 mg/dL (ref 8.4–10.5)
Chloride: 104 mEq/L (ref 96–112)
Creatinine, Ser: 1.01 mg/dL (ref 0.40–1.50)
GFR: 82.29 mL/min (ref >60.00)
Glucose, Bld: 97 mg/dL (ref 70–99)
Potassium: 4.6 mEq/L (ref 3.5–5.1)
Sodium: 140 mEq/L (ref 135–145)
Total Bilirubin: 0.8 mg/dL (ref 0.2–1.2)
Total Protein: 6.8 g/dL (ref 6.0–8.3)

## 2023-02-22 LAB — PSA: PSA: 0.62 ng/mL (ref 0.10–4.00)

## 2023-02-22 LAB — LIPID PANEL
Cholesterol: 141 mg/dL (ref 0–200)
HDL: 45.3 mg/dL (ref >39.00)
LDL Cholesterol: 80 mg/dL (ref 0–99)
NonHDL: 95.57
Total CHOL/HDL Ratio: 3
Triglycerides: 77 mg/dL (ref 0.0–149.0)
VLDL: 15.4 mg/dL (ref 0.0–40.0)

## 2023-02-22 LAB — HEMOGLOBIN A1C: Hgb A1c MFr Bld: 5.6 % (ref 4.6–6.5)

## 2023-02-22 NOTE — Patient Instructions (Signed)
Keep up the good work with diet and exercise.  I will check labs today including cholesterol level.  If LDL is above 70 we always have the option of adjusting meds or consider a coronary calcium scoring test to evaluate for any plaque within the arteries.  We can discuss this further once I see your labs.  No other concerns today on your physical.  Take care!  Preventive Care 58-58 Years Old, Male Preventive care refers to lifestyle choices and visits with your health care provider that can promote health and wellness. Preventive care visits are also called wellness exams. What can I expect for my preventive care visit? Counseling During your preventive care visit, your health care provider may ask about your: Medical history, including: Past medical problems. Family medical history. Current health, including: Emotional well-being. Home life and relationship well-being. Sexual activity. Lifestyle, including: Alcohol, nicotine or tobacco, and drug use. Access to firearms. Diet, exercise, and sleep habits. Safety issues such as seatbelt and bike helmet use. Sunscreen use. Work and work Astronomer. Physical exam Your health care provider will check your: Height and weight. These may be used to calculate your BMI (body mass index). BMI is a measurement that tells if you are at a healthy weight. Waist circumference. This measures the distance around your waistline. This measurement also tells if you are at a healthy weight and may help predict your risk of certain diseases, such as type 2 diabetes and high blood pressure. Heart rate and blood pressure. Body temperature. Skin for abnormal spots. What immunizations do I need?  Vaccines are usually given at various ages, according to a schedule. Your health care provider will recommend vaccines for you based on your age, medical history, and lifestyle or other factors, such as travel or where you work. What tests do I need? Screening Your  health care provider may recommend screening tests for certain conditions. This may include: Lipid and cholesterol levels. Diabetes screening. This is done by checking your blood sugar (glucose) after you have not eaten for a while (fasting). Hepatitis B test. Hepatitis C test. HIV (human immunodeficiency virus) test. STI (sexually transmitted infection) testing, if you are at risk. Lung cancer screening. Prostate cancer screening. Colorectal cancer screening. Talk with your health care provider about your test results, treatment options, and if necessary, the need for more tests. Follow these instructions at home: Eating and drinking  Eat a diet that includes fresh fruits and vegetables, whole grains, lean protein, and low-fat dairy products. Take vitamin and mineral supplements as recommended by your health care provider. Do not drink alcohol if your health care provider tells you not to drink. If you drink alcohol: Limit how much you have to 0-2 drinks a day. Know how much alcohol is in your drink. In the [place] one drink equals one 12 oz bottle of beer (355 mL), one 5 oz glass of wine (148 mL), or one 1 oz glass of hard liquor (44 mL). Lifestyle Brush your teeth every morning and night with fluoride toothpaste. Floss one time each day. Exercise for at least 30 minutes 5 or more days each week. Do not use any products that contain nicotine or tobacco. These products include cigarettes, chewing tobacco, and vaping devices, such as e-cigarettes. If you need help quitting, ask your health care provider. Do not use drugs. If you are sexually active, practice safe sex. Use a condom or other form of protection to prevent STIs. Take aspirin only as told by your health  care provider. Make sure that you understand how much to take and what form to take. Work with your health care provider to find out whether it is safe and beneficial for you to take aspirin daily. Find healthy ways to manage  stress, such as: Meditation, yoga, or listening to music. Journaling. Talking to a trusted person. Spending time with friends and family. Minimize exposure to UV radiation to reduce your risk of skin cancer. Safety Always wear your seat belt while driving or riding in a vehicle. Do not drive: If you have been drinking alcohol. Do not ride with someone who has been drinking. When you are tired or distracted. While texting. If you have been using any mind-altering substances or drugs. Wear a helmet and other protective equipment during sports activities. If you have firearms in your house, make sure you follow all gun safety procedures. What's next? Go to your health care provider once a year for an annual wellness visit. Ask your health care provider how often you should have your eyes and teeth checked. Stay up to date on all vaccines. This information is not intended to replace advice given to you by your health care provider. Make sure you discuss any questions you have with your health care provider. Document Revised: 12/30/2020 Document Reviewed: 12/30/2020 Elsevier Patient Education  2024 [employer].

## 2023-02-22 NOTE — Progress Notes (Signed)
Subjective:  Patient ID: Stephen Obrien, male    DOB: 11-May-1966  Age: 57 y.o. MRN: [id number]  CC:  Chief Complaint  Patient presents with   Annual Exam    Pt is doing well, and is fasting     HPI Stephen Obrien presents for Annual Exam Care team PCP, me Allergist Dr. Chattaroy Callas [facility] E[facility], history of tinnitus. Dermatology - Dr. Doreen Beam - appt in few weeks. Monitoring.   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 Travel.   Hyperlipidemia: Lipitor 20 mg daily, no new myalgias/side effects.  Few pound weight gain, difficult diet, exercise with travel. Lab Results  Component Value Date   CHOL 145 02/02/2022   HDL 48.80 02/02/2022   LDLCALC 82 02/02/2022   TRIG 74.0 02/02/2022   CHOLHDL 3 02/02/2022   Lab Results  Component Value Date   ALT 20 02/02/2022   AST 22 02/02/2022   ALKPHOS 59 02/02/2022   BILITOT 0.9 02/02/2022   OSA on CPAP History of OSA but mild symptoms, off CPAP at his last physical and was doing well after weight loss. Still sleeping well, ewll rested, no daytime somnolence.   Allergic urticaria Allergist Dr. [place] Callas. Treated with Xolair, xyzal.  Still working well - no flairs.      02/22/2023    8:06 AM 02/02/2022   10:18 AM 12/07/2021   10:36 AM 11/26/2020    9:01 AM 11/22/2019    8:32 AM  Depression screen PHQ 2/9  Decreased Interest 0 0 0 0 0  Down, Depressed, Hopeless 0 0 0 0 0  PHQ - 2 Score 0 0 0 0 0  Altered sleeping 0  0    Tired, decreased energy 0  0    Change in appetite 0  0    Feeling bad or failure about yourself  0  0    Trouble concentrating 0  0    Moving slowly or fidgety/restless 0  0    Suicidal thoughts 0  0    PHQ-9 Score 0  0    Difficult doing work/chores   Not difficult at all      Health Maintenance  Topic Date Due   COVID-19 Vaccine (4 - 2023-24 season) 03/18/2022   INFLUENZA VACCINE  02/16/2023   Colonoscopy  01/20/2026   DTaP/Tdap/Td (3  - Td or Tdap) 11/07/2027   Hepatitis C Screening  Completed   HIV Screening  Completed   Zoster Vaccines- Shingrix  Completed   HPV VACCINES  Aged Out  Colonoscopy 01/21/2016, Dr. Rhea Belton, diverticulosis, internal hemorrhoids, repeat 10 years. Prostate: does not have family history of prostate cancer The natural history of prostate cancer and ongoing controversy regarding screening and potential treatment outcomes of prostate cancer has been discussed with the patient. The meaning of a false positive PSA and a false negative PSA has been discussed. He indicates understanding of the limitations of this screening test and wishes to proceed with screening PSA testing. Lab Results  Component Value Date   PSA1 0.7 11/19/2019   PSA1 0.6 11/15/2018   PSA1 0.7 11/06/2017   PSA 0.48 02/02/2022   PSA 0.52 11/26/2020   PSA 0.63 09/10/2015     Immunization History  Administered Date(s) Administered   Influenza Inj Mdck Quad Pf 08/25/2018   Influenza, Seasonal, Injecte, Preservative Fre 06/12/2012   Influenza,inj,Quad PF,6+ Mos 09/06/2013, 09/09/2014, 09/10/2015, 04/01/2019, 05/27/2020   Influenza-Unspecified 05/30/2021   PFIZER(Purple Top)SARS-COV-2 Vaccination  09/20/2019, 10/25/2019, 06/03/2020   Tdap 04/24/2007, 11/06/2017   Zoster Recombinant(Shingrix) 03/30/2018, 08/25/2018  COVID booster recommended with flu vaccine when available, had one in December and April prior to [place] trip.   No results found. Yearly ophthalmology exam, no acute changes.   Dental: Every 6 months, last week.   Alcohol: 6 to 8 glasses/week.   Tobacco: None  Exercise: Cycling few days per week, walking for golf, jump rope, as well as resistance exercises few days per week.  Wt Readings from Last 3 Encounters:  02/22/23 200 lb 6.4 oz (90.9 kg)  02/02/22 202 lb 3.2 oz (91.7 kg)  12/07/21 201 lb (91.2 kg)    History Patient Active Problem List   Diagnosis Date Noted   OSA on CPAP 11/21/2018   Conductive  hearing loss of left ear with unrestricted hearing of right ear 05/15/2017   Impacted cerumen of left ear 05/15/2017   Tinnitus of both ears 05/15/2017   Insomnia 09/10/2015   Allergic urticaria 09/06/2013   Other and unspecified hyperlipidemia 09/06/2013   Past Medical History:  Diagnosis Date   Allergy    Anxiety    Arthritis    Hyperlipidemia    OSA on CPAP 11/21/2018   Past Surgical History:  Procedure Laterality Date   DISTAL BICEPS TENDON REPAIR  06/2018   TEAR DUCT PROBING  1968   VASECTOMY     WISDOM TOOTH EXTRACTION  1983   No Known Allergies Prior to Admission medications   Medication Sig Start Date End Date Taking? Authorizing Provider  atorvastatin (LIPITOR) 20 MG tablet TAKE 1 TABLET(20 MG) BY MOUTH DAILY 02/14/23  Yes Shade Flood, MD  levocetirizine (XYZAL) 5 MG tablet Take 1 tablet (5 mg total) by mouth every evening. 09/10/15  Yes Daub, Maylon Peppers, MD  Omalizumab Geoffry Paradise [place]) Inject 150 mg into the skin.    Yes [provider]   Social History   Socioeconomic History   Marital status: Married    Spouse name: Not on file   Number of children: Not on file   Years of education: Not on file   Highest education level: Not on file  Occupational History   Not on file  Tobacco Use   Smoking status: Never   Smokeless tobacco: Never  Vaping Use   Vaping status: Never Used  Substance and Sexual Activity   Alcohol use: Yes    Alcohol/week: 6.0 standard drinks of alcohol    Types: 6 Standard drinks or equivalent per week    Comment: wine   Drug use: No   Sexual activity: Yes  Other Topics Concern   Not on file  Social History Narrative   Not on file   Social Determinants of Health   Financial Resource Strain: Not on file  Food Insecurity: Not on file  Transportation Needs: Not on file  Physical Activity: Not on file  Stress: Not on file  Social Connections: Not on file  Intimate Partner Violence: Not on file    Review of Systems 13 point  review of systems per patient health survey noted.  Negative other than as indicated above or in HPI.    Objective:   Vitals:   02/22/23 0808  BP: 118/76  Pulse: 67  Temp: 98.2 F (36.8 C)  TempSrc: Temporal  SpO2: 96%  Weight: 200 lb 6.4 oz (90.9 kg)  Height: 6' (1.829 m)     Physical Exam Vitals reviewed.  Constitutional:      Appearance: He is  well-developed.  HENT:     Head: Normocephalic and atraumatic.     Right Ear: External ear normal.     Left Ear: External ear normal.  Eyes:     Conjunctiva/sclera: Conjunctivae normal.     Pupils: Pupils are equal, round, and reactive to light.  Neck:     Thyroid: No thyromegaly.  Cardiovascular:     Rate and Rhythm: Normal rate and regular rhythm.     Heart sounds: Normal heart sounds.  Pulmonary:     Effort: Pulmonary effort is normal. No respiratory distress.     Breath sounds: Normal breath sounds. No wheezing.  Abdominal:     General: There is no distension.     Palpations: Abdomen is soft.     Tenderness: There is no abdominal tenderness.  Musculoskeletal:        General: No tenderness. Normal range of motion.     Cervical back: Normal range of motion and neck supple.  Lymphadenopathy:     Cervical: No cervical adenopathy.  Skin:    General: Skin is warm and dry.  Neurological:     Mental Status: He is alert and oriented to person, place, and time.     Deep Tendon Reflexes: Reflexes are normal and symmetric.  Psychiatric:        Behavior: Behavior normal.        Assessment & Plan:  Stephen Obrien is a 57 y.o. male . Annual physical exam - Plan: Hemoglobin A1c, Lipid panel, Comprehensive metabolic panel, PSA, CBC with Differential/Platelet  - -anticipatory guidance as below in AVS, screening labs above. Health maintenance items as above in HPI discussed/recommended as applicable.   Screening, anemia, deficiency, iron - Plan: CBC with Differential/Platelet  Screening for prostate cancer - Plan:  PSA  Screening for diabetes mellitus (DM) - Plan: Hemoglobin A1c, Comprehensive metabolic panel  Hyperlipidemia, unspecified hyperlipidemia type - Plan: Lipid panel, Comprehensive metabolic panel  -Tolerating Lipitor, continue same dose.  Check lipid panel, consider adjustment in dose if LDL above 70, or possible coronary calcium scoring if needed.  Continue diet/exercise.  Weight stable.  No orders of the defined types were placed in this encounter.  Patient Instructions  Keep up the good work with diet and exercise.  I will check labs today including cholesterol level.  If LDL is above 70 we always have the option of adjusting meds or consider a coronary calcium scoring test to evaluate for any plaque within the arteries.  We can discuss this further once I see your labs.  No other concerns today on your physical.  Take care!  Preventive Care 66-70 Years Old, Male Preventive care refers to lifestyle choices and visits with your health care provider that can promote health and wellness. Preventive care visits are also called wellness exams. What can I expect for my preventive care visit? Counseling During your preventive care visit, your health care provider may ask about your: Medical history, including: Past medical problems. Family medical history. Current health, including: Emotional well-being. Home life and relationship well-being. Sexual activity. Lifestyle, including: Alcohol, nicotine or tobacco, and drug use. Access to firearms. Diet, exercise, and sleep habits. Safety issues such as seatbelt and bike helmet use. Sunscreen use. Work and work Astronomer. Physical exam Your health care provider will check your: Height and weight. These may be used to calculate your BMI (body mass index). BMI is a measurement that tells if you are at a healthy weight. Waist circumference. This measures the distance around your  waistline. This measurement also tells if you are at a healthy  weight and may help predict your risk of certain diseases, such as type 2 diabetes and high blood pressure. Heart rate and blood pressure. Body temperature. Skin for abnormal spots. What immunizations do I need?  Vaccines are usually given at various ages, according to a schedule. Your health care provider will recommend vaccines for you based on your age, medical history, and lifestyle or other factors, such as travel or where you work. What tests do I need? Screening Your health care provider may recommend screening tests for certain conditions. This may include: Lipid and cholesterol levels. Diabetes screening. This is done by checking your blood sugar (glucose) after you have not eaten for a while (fasting). Hepatitis B test. Hepatitis C test. HIV (human immunodeficiency virus) test. STI (sexually transmitted infection) testing, if you are at risk. Lung cancer screening. Prostate cancer screening. Colorectal cancer screening. Talk with your health care provider about your test results, treatment options, and if necessary, the need for more tests. Follow these instructions at home: Eating and drinking  Eat a diet that includes fresh fruits and vegetables, whole grains, lean protein, and low-fat dairy products. Take vitamin and mineral supplements as recommended by your health care provider. Do not drink alcohol if your health care provider tells you not to drink. If you drink alcohol: Limit how much you have to 0-2 drinks a day. Know how much alcohol is in your drink. In the [place] one drink equals one 12 oz bottle of beer (355 mL), one 5 oz glass of wine (148 mL), or one 1 oz glass of hard liquor (44 mL). Lifestyle Brush your teeth every morning and night with fluoride toothpaste. Floss one time each day. Exercise for at least 30 minutes 5 or more days each week. Do not use any products that contain nicotine or tobacco. These products include cigarettes, chewing tobacco, and  vaping devices, such as e-cigarettes. If you need help quitting, ask your health care provider. Do not use drugs. If you are sexually active, practice safe sex. Use a condom or other form of protection to prevent STIs. Take aspirin only as told by your health care provider. Make sure that you understand how much to take and what form to take. Work with your health care provider to find out whether it is safe and beneficial for you to take aspirin daily. Find healthy ways to manage stress, such as: Meditation, yoga, or listening to music. Journaling. Talking to a trusted person. Spending time with friends and family. Minimize exposure to UV radiation to reduce your risk of skin cancer. Safety Always wear your seat belt while driving or riding in a vehicle. Do not drive: If you have been drinking alcohol. Do not ride with someone who has been drinking. When you are tired or distracted. While texting. If you have been using any mind-altering substances or drugs. Wear a helmet and other protective equipment during sports activities. If you have firearms in your house, make sure you follow all gun safety procedures. What's next? Go to your health care provider once a year for an annual wellness visit. Ask your health care provider how often you should have your eyes and teeth checked. Stay up to date on all vaccines. This information is not intended to replace advice given to you by your health care provider. Make sure you discuss any questions you have with your health care provider. Document Revised: 12/30/2020 Document Reviewed: 12/30/2020  Elsevier Patient Education  2024 [employer].     Signed,   Meredith Staggers, MD [facility] 02/22/23 8:28 AM

## 2023-02-28 DIAGNOSIS — L814 Other melanin hyperpigmentation: Secondary | ICD-10-CM | POA: Diagnosis not present

## 2023-02-28 DIAGNOSIS — L570 Actinic keratosis: Secondary | ICD-10-CM | POA: Diagnosis not present

## 2023-02-28 DIAGNOSIS — L300 Nummular dermatitis: Secondary | ICD-10-CM | POA: Diagnosis not present

## 2023-02-28 DIAGNOSIS — L821 Other seborrheic keratosis: Secondary | ICD-10-CM | POA: Diagnosis not present

## 2023-03-01 MED FILL — XOLAIR 150 MG/ML SUBCUTANEOUS SYRINGE: SUBCUTANEOUS | 28 days supply | Qty: 2 | Fill #5

## 2023-03-01 NOTE — Unmapped (Signed)
[facility] [employer] 's Xolair shipment will be sent out as a result of sufficient inventory of the drug.      I have spoken with the patient  at [phone]  and communicated the delivery change. We will reschedule the medication for the delivery date that the patient agreed upon.  We have confirmed the delivery date as 8/15, via ups.

## 2023-03-13 DIAGNOSIS — L509 Urticaria, unspecified: Secondary | ICD-10-CM | POA: Diagnosis not present

## 2023-03-13 DIAGNOSIS — J3089 Other allergic rhinitis: Secondary | ICD-10-CM | POA: Diagnosis not present

## 2023-03-13 DIAGNOSIS — J301 Allergic rhinitis due to pollen: Secondary | ICD-10-CM | POA: Diagnosis not present

## 2023-03-22 NOTE — Unmapped (Signed)
[facility] Refill Coordination Note    Peter Santiago, DOB: 1966/06/07  Phone: [phone] (home)       All above HIPAA information was verified with patient.         03/22/2023     9:44 AM   Specialty Rx Medication Refill Questionnaire   Which Medications would you like refilled and shipped? Xolair   Please list all current allergies: none   Have you missed any doses in the last 30 days? No   Have you had any changes to your medication(s) since your last refill? No   How many days remaining of each medication do you have at home? 1 dose   If receiving an injectable medication, next injection date is 03/25/2023   Have you experienced any side effects in the last 30 days? No   Please enter the full address (street address, city, state, zip code) where you would like your medication(s) to be delivered to. [address], [place], [name]  [address], [place]   Please specify on which day you would like your medication(s) to arrive. Note: if you need your medication(s) within 3 days, please call the pharmacy to schedule your order at [phone]  03/30/2023   Has your insurance changed since your last refill? No   Would you like a pharmacist to call you to discuss your medication(s)? No   Do you require a signature for your package? (Note: if we are billing Medicare Part B or your order contains a controlled substance, we will require a signature) No         Completed refill call assessment today to schedule patient's medication shipment from the [facility] [phone]).  All relevant notes have been reviewed.       Confirmed patient received a Conservation officer, historic buildings and a Surveyor, mining with first shipment. The patient will receive a drug information handout for each medication shipped and additional FDA Medication Guides as required.         REFERRAL TO PHARMACIST     Referral to the pharmacist: Not needed      [facility]     Shipping address confirmed in Epic.     Delivery Scheduled: Yes, Expected medication delivery date: 03/30/23.     Medication will be delivered via UPS to the prescription address in Epic WAM.    Earlie Schank' W [facility] Specialty Technician

## 2023-03-29 MED FILL — XOLAIR 150 MG/ML SUBCUTANEOUS SYRINGE: SUBCUTANEOUS | 28 days supply | Qty: 2 | Fill #6

## 2023-04-15 MED ORDER — XOLAIR 150 MG/ML SUBCUTANEOUS SYRINGE
SUBCUTANEOUS | 6 refills | 28 days
Start: 2023-04-15 — End: ?

## 2023-04-16 MED ORDER — XOLAIR 150 MG/ML SUBCUTANEOUS SYRINGE
SUBCUTANEOUS | 0 refills | 0 days
Start: 2023-04-16 — End: ?

## 2023-04-16 MED ORDER — OMALIZUMAB 150 MG/ML SUBCUTANEOUS SYRINGE
SUBCUTANEOUS | 6 refills | 28 days
Start: 2023-04-16 — End: ?

## 2023-04-29 NOTE — Unmapped (Signed)
[facility] Delivery Pharm[facility] Refill Coordination Note    Specialty Medication(s) to be Shipped:   Inflammatory Disorders: Xolair    Other medication(s) to be shipped: No additional medications requested for fill at this time     Peter Santiago, DOB: 1965/07/27  Phone: [phone] (home)       All above HIPAA information was verified with patient.     Was a Nurse, learning disability used for this call? No    Completed refill call assessment today to schedule patient's medication shipment from the [facility] Delivery [facility]  [phone]).  All relevant notes have been reviewed.     Specialty medication(s) and dose(s) confirmed: Patient reports changes to the regimen as follows: Pt reports taking Xolair every 6-8 weeks per direction from provider    Changes to medications: Peter Santiago reports no changes at this time.  Changes to insurance: No  New side effects reported not previously addressed with a pharmacist or physician: None reported  Questions for the pharmacist: No    Confirmed patient received a Conservation officer, historic buildings and a Surveyor, mining with first shipment. The patient will receive a drug information handout for each medication shipped and additional FDA Medication Guides as required.       DISEASE/MEDICATION-SPECIFIC INFORMATION        For patients on injectable medications: Patient currently has 0 doses left.  Next injection is scheduled for ~12/4.    SPECIALTY MEDICATION ADHERENCE     Medication Adherence    Patient reported X missed doses in the last month: 0  Specialty Medication: XOLAIR 150 mg/mL syringe (omalizumab)  Patient is on additional specialty medications: No  Patient is on more than two specialty medications: No  Any gaps in refill history greater than 2 weeks in the last 3 months: yes  Demonstrates understanding of importance of adherence: yes  Informant: patient              Were doses missed due to medication being on hold? NoPt reports taking Xolair every 6-8 weeks per direction from provider    Xolair 150 mg/ml: 0 doses of medicine on hand     REFERRAL TO PHARMACIST     Referral to the pharmacist: Not needed      [facility]     Shipping address confirmed in Epic.       Delivery Scheduled: Yes, Expected medication delivery date: 06/13/23.     Medication will be delivered via UPS to the prescription address in Epic WAM.    Peter Santiago, PharmD   [facility]  Specialty Pharmacist

## 2023-05-23 ENCOUNTER — Telehealth: Payer: BC Managed Care – PPO

## 2023-05-23 DIAGNOSIS — J208 Acute bronchitis due to other specified organisms: Secondary | ICD-10-CM

## 2023-05-23 DIAGNOSIS — B9689 Other specified bacterial agents as the cause of diseases classified elsewhere: Secondary | ICD-10-CM

## 2023-05-24 MED ORDER — BENZONATATE 100 MG PO CAPS
100.0000 mg | ORAL_CAPSULE | Freq: Three times a day (TID) | ORAL | 0 refills | Status: DC | PRN
Start: 2023-05-24 — End: 2024-02-23

## 2023-05-24 MED ORDER — DOXYCYCLINE HYCLATE 100 MG PO TABS
100.0000 mg | ORAL_TABLET | Freq: Two times a day (BID) | ORAL | 0 refills | Status: DC
Start: 2023-05-24 — End: 2024-02-23

## 2023-05-24 NOTE — Progress Notes (Signed)
I have spent 5 minutes in review of e-visit questionnaire, review and updating patient chart, medical decision making and response to patient.   Mia Milan Cody Jacklynn Dehaas, PA-C    

## 2023-05-24 NOTE — Progress Notes (Signed)
 E-Visit for Cough   We are sorry that you are not feeling well.  Here is how we plan to help!  Based on your presentation I believe you most likely have A cough due to bacteria.  When patients have a productive cough with a change in color or increased sputum production, we are concerned about bacterial bronchitis.  If left untreated it can progress to pneumonia.  If your symptoms do not improve with your treatment plan it is important that you contact your provider.   I have prescribed Doxycycline 100 mg twice a day for 7 days     In addition you may use A prescription cough medication called Tessalon Perles 100mg . You may take 1-2 capsules every 8 hours as needed for your cough.   From your responses in the eVisit questionnaire you describe inflammation in the upper respiratory tract which is causing a significant cough.  This is commonly called Bronchitis and has four common causes:   Allergies Viral Infections Acid Reflux Bacterial Infection Allergies, viruses and acid reflux are treated by controlling symptoms or eliminating the cause. An example might be a cough caused by taking certain blood pressure medications. You stop the cough by changing the medication. Another example might be a cough caused by acid reflux. Controlling the reflux helps control the cough.  USE OF BRONCHODILATOR ("RESCUE") INHALERS: There is a risk from using your bronchodilator too frequently.  The risk is that over-reliance on a medication which only relaxes the muscles surrounding the breathing tubes can reduce the effectiveness of medications prescribed to reduce swelling and congestion of the tubes themselves.  Although you feel brief relief from the bronchodilator inhaler, your asthma may actually be worsening with the tubes becoming more swollen and filled with mucus.  This can delay other crucial treatments, such as oral steroid medications. If you need to use a bronchodilator inhaler daily, several times per  day, you should discuss this with your provider.  There are probably better treatments that could be used to keep your asthma under control.     HOME CARE Only take medications as instructed by your medical team. Complete the entire course of an antibiotic. Drink plenty of fluids and get plenty of rest. Avoid close contacts especially the very young and the elderly Cover your mouth if you cough or cough into your sleeve. Always remember to wash your hands A steam or ultrasonic humidifier can help congestion.   GET HELP RIGHT AWAY IF: You develop worsening fever. You become short of breath You cough up blood. Your symptoms persist after you have completed your treatment plan MAKE SURE YOU  Understand these instructions. Will watch your condition. Will get help right away if you are not doing well or get worse.    Thank you for choosing an e-visit.  Your e-visit answers were reviewed by a board certified advanced clinical practitioner to complete your personal care plan. Depending upon the condition, your plan could have included both over the counter or prescription medications.  Please review your pharmacy choice. Make sure the pharmacy is open so you can pick up prescription now. If there is a problem, you may contact your provider through [employer] and have the prescription routed to another pharmacy.  Your safety is important to [place]. If you have drug allergies check your prescription carefully.   For the next 24 hours you can use MyChart to ask questions about today's visit, request a non-urgent call back, or ask for a work  or school excuse. You will get an email in the next two days asking about your experience. I hope that your e-visit has been valuable and will speed your recovery.

## 2023-06-12 MED FILL — XOLAIR 150 MG/ML SUBCUTANEOUS SYRINGE: SUBCUTANEOUS | 28 days supply | Qty: 2 | Fill #0

## 2023-06-20 DIAGNOSIS — M7711 Lateral epicondylitis, right elbow: Secondary | ICD-10-CM | POA: Diagnosis not present

## 2023-06-26 DIAGNOSIS — M6281 Muscle weakness (generalized): Secondary | ICD-10-CM | POA: Diagnosis not present

## 2023-06-26 DIAGNOSIS — M7711 Lateral epicondylitis, right elbow: Secondary | ICD-10-CM | POA: Diagnosis not present

## 2023-07-14 DIAGNOSIS — M6281 Muscle weakness (generalized): Secondary | ICD-10-CM | POA: Diagnosis not present

## 2023-07-14 DIAGNOSIS — M7711 Lateral epicondylitis, right elbow: Secondary | ICD-10-CM | POA: Diagnosis not present

## 2023-07-21 MED ORDER — XOLAIR 150 MG/ML SUBCUTANEOUS SYRINGE
SUBCUTANEOUS | 6 refills | 0.00 days
Start: 2023-07-21 — End: ?

## 2023-07-28 NOTE — Unmapped (Signed)
[facility] and Home Delivery Pharmacy Clinical Assessment & Refill Coordination Note    Peter Santiago, DOB: 1965/09/29  Phone: [phone] (home) [phone] (work)    All above HIPAA information was verified with patient.     Was a Nurse, learning disability used for this call? No    Specialty Medication(s):   Inflammatory Disorders: Xolair     Current Outpatient Medications   Medication Sig Dispense Refill    alcohol swabs (ALCOHOL PADS) PadM Use as directed 100 each 11    atorvastatin (LIPITOR) 20 MG tablet Take 1 tablet (20 mg total) by mouth daily.      levocetirizine (XYZAL) 5 MG tablet Take 1 tablet (5 mg total) by mouth every evening.      lutein 10 mg Tab Take 1 tablet (10 mg total) by mouth daily.      omalizumab (XOLAIR) 150 mg/mL syringe INJECT 2 SYRINGES (300 MG) UNDER THE SKIN (SUBCUTANEOUS INJECTION) EVERY 4 WEEKS 2 mL 6     No current facility-administered medications for this visit.        Changes to medications: [name] reports no changes at this time.    No Known Allergies    Changes to allergies: No    SPECIALTY MEDICATION ADHERENCE     Xolair 150 mg/ml: 0 doses of medicine on hand     Medication Adherence    Patient reported X missed doses in the last month: 0  Specialty Medication: Xolair 150 mg/mL  Patient is on additional specialty medications: No  Patient is on more than two specialty medications: No  Any gaps in refill history greater than 2 weeks in the last 3 months: no  Demonstrates understanding of importance of adherence: yes  Informant: patient          Specialty medication(s) dose(s) confirmed: Regimen is correct and unchanged.     Are there any concerns with adherence?  No - stretched some doses due to international travel       Adherence counseling provided? Yes: We discussed trying to get a vacation override in the future for planned trips. He verbalized understanding but states some countries have restrictions on bring medicine into their country. He states he has 4 international trips scheduled this year and may seek vacation override if country allows him to bring in medicine.     CLINICAL MANAGEMENT AND INTERVENTION      Clinical Benefit Assessment:    Do you feel the medicine is effective or helping your condition? Yes    Clinical Benefit counseling provided? Not needed    Adverse Effects Assessment:    Are you experiencing any side effects? No    Are you experiencing difficulty administering your medicine? No    Quality of Life Assessment:    Quality of Life    Rheumatology  Oncology  Dermatology  Cystic Fibrosis          How many days over the past month did your urticaria  keep you from your normal activities? For example, brushing your teeth or getting up in the morning. Patient declined to answer    Have you discussed this with your provider? Not needed    Acute Infection Status:    Acute infections noted within Epic:  No active infections  Patient reported infection: None    Therapy Appropriateness:    Is therapy appropriate based on current medication list, adverse reactions, adherence, clinical benefit and progress toward achieving therapeutic goals? Yes, therapy is appropriate and should be continued  DISEASE/MEDICATION-SPECIFIC INFORMATION      For patients on injectable medications: Patient currently has 0 doses left.  Next injection is scheduled for 1/18.    Chronic Inflammatory Diseases: Have you experienced any flares in the last month? Yes, minor flares when doses are stretched out but overall well controlled  Has this been reported to your provider? No    PATIENT SPECIFIC NEEDS     Does the patient have any physical, cognitive, or cultural barriers? No    Is the patient high risk? No    Did the patient require a clinical intervention? No    Does the patient require physician intervention or other additional services (i.e., nutrition, smoking cessation, social work)? No    SOCIAL DETERMINANTS OF HEALTH     At the [facility], we have learned that life circumstances - like trouble affording food, housing, utilities, or transportation can affect the health of many of our patients.   That is why we wanted to ask: are you currently experiencing any life circumstances that are negatively impacting your health and/or quality of life? Patient declined to answer    Social Drivers of Health     Food Insecurity: Not on file   Internet Connectivity: Not on file   Housing/Utilities: Not on file   Tobacco Use: Not on file (10/16/2012)   Transportation Needs: Not on file   Alcohol Use: Not on file   Interpersonal Safety: Not on file   Physical Activity: Not on file   Intimate Partner Violence: Not on file   Stress: Not on file   Substance Use: Not on file (07/28/2023)   Social Connections: Not on file   Financial Resource Strain: Not on file   Depression: Not on file   Health Literacy: Not on file       Would you be willing to receive help with any of the needs that you have identified today? Not applicable       SHIPPING     Specialty Medication(s) to be Shipped:   Inflammatory Disorders: Xolair    Other medication(s) to be shipped: No additional medications requested for fill at this time     Changes to insurance: No    Delivery Scheduled: Yes, Expected medication delivery date: 08/02/23.     Medication will be delivered via UPS to the confirmed prescription address in [facility].    The patient will receive a drug information handout for each medication shipped and additional FDA Medication Guides as required.  Verified that patient has previously received a Conservation officer, historic buildings and a Surveyor, mining.    The patient or caregiver noted above participated in the development of this care plan and knows that they can request review of or adjustments to the care plan at any time.      All of the patient's questions and concerns have been addressed.    Oliva Bustard, PharmD   [facility] Delivery Pharm[facility] Specialty Pharmacist

## 2023-08-01 MED FILL — XOLAIR 150 MG/ML SUBCUTANEOUS SYRINGE: SUBCUTANEOUS | 28 days supply | Qty: 2 | Fill #0

## 2023-08-02 DIAGNOSIS — M7711 Lateral epicondylitis, right elbow: Secondary | ICD-10-CM | POA: Diagnosis not present

## 2023-08-29 NOTE — Unmapped (Signed)
[facility] Specialty and Home Delivery Pharmacy Refill Coordination Note    Peter Santiago, Linn: [birth year]  Phone: [phone] (home) [phone] (work)      All above HIPAA information was verified with patient.         08/25/2023     3:06 PM   Specialty Rx Medication Refill Questionnaire   Which Medications would you like refilled and shipped? Xolair   Please list all current allergies: none   Have you missed any doses in the last 30 days? No   Have you had any changes to your medication(s) since your last refill? No   How many days remaining of each medication do you have at home? 0   If receiving an injectable medication, next injection date is 09/14/2023   Have you experienced any side effects in the last 30 days? No   Please enter the full address (street address, city, state, zip code) where you would like your medication(s) to be delivered to. [address], [place], [name]  [address], [place]   Please specify on which day you would like your medication(s) to arrive. Note: if you need your medication(s) within 3 days, please call the pharmacy to schedule your order at [phone]  09/12/2023   Has your insurance changed since your last refill? No   Would you like a pharmacist to call you to discuss your medication(s)? No   Do you require a signature for your package? (Note: if we are billing Medicare Part B or your order contains a controlled substance, we will require a signature) No         Completed refill call assessment today to schedule patient's medication shipment from the [facility] [phone]).  All relevant notes have been reviewed.       Confirmed patient received a Conservation officer, historic buildings and a Surveyor, mining with first shipment. The patient will receive a drug information handout for each medication shipped and additional FDA Medication Guides as required.         REFERRAL TO PHARMACIST     Referral to the pharmacist: Not needed      [facility]     Shipping address confirmed in Epic.     Delivery Scheduled: Yes, Expected medication delivery date: 09/12/23.     Medication will be delivered via UPS to the prescription address in Epic WAM.    Unk Lightning   [facility] Delivery Pharm[facility] Specialty Technician

## 2023-09-11 MED FILL — XOLAIR 150 MG/ML SUBCUTANEOUS SYRINGE: SUBCUTANEOUS | 28 days supply | Qty: 2 | Fill #1

## 2023-10-26 NOTE — Unmapped (Signed)
 [facility] has made a second and final attempt to reach this patient to refill the following medication:omalizumab: XOLAIR 150 mg/mL syringe.      We have left voicemails on the following phone numbers: [phone], have been unable to leave messages on the following phone numbers: [phone] and  [phone], have sent a text message to the following phone numbers: [phone], and have sent a Mychart questionnaire..    Dates contacted: 10/09/23  and   10/26/23   Last scheduled delivery: 09/11/23     The patient may be at risk of non-compliance with this medication. The patient should call the [facility] at [phone]  Option 4, then Option 3: Allergy, Immunology, Pulmonary, Neurology to refill medication.    Ricci Barker   [facility] and [facility]

## 2023-11-06 NOTE — Unmapped (Signed)
 [facility] Special[facility] and Home Delivery Pharmacy Refill Coordination Note    Specialty Medication(s) to be Shipped:   Inflammatory Disorders: Xolair     Other medication(s) to be shipped: No additional medications requested for fill at this time     Peter Santiago, DOB: 23-Nov-1965  Phone: [phone] (home) [phone] (work)      All above HIPAA information was verified with patient.     Was a Nurse, learning disability used for this call? No    Completed refill call assessment today to schedule patient's medication shipment from the [facility] Delivery Pharm[facility]  [phone]).  All relevant notes have been reviewed.     Specialty medication(s) and dose(s) confirmed: Regimen is correct and unchanged.   Changes to medications: Peter Santiago reports no changes at this time.  Changes to insurance: No  New side effects reported not previously addressed with a pharmacist or physician: None reported  Questions for the pharmacist: No    Confirmed patient received a Conservation officer, historic buildings and a Surveyor, mining with first shipment. The patient will receive a drug information handout for each medication shipped and additional FDA Medication Guides as required.       DISEASE/MEDICATION-SPECIFIC INFORMATION        For patients on injectable medications: Patient currently has 0 doses left.  Next injection is scheduled for 11/15/23.    SPECIALTY MEDICATION ADHERENCE     Medication Adherence    Patient reported X missed doses in the last month: 1  Specialty Medication: Xolair  150 mg/mL  Patient is on additional specialty medications: No  Patient is on more than two specialty medications: No  Any gaps in refill history greater than 2 weeks in the last 3 months: yes  Demonstrates understanding of importance of adherence: yes  Informant: patient          Were doses missed due to medication being on hold?  No - pt was traveling overseas and missed dose. He often travels for work and tends to take Xolair  every 6-8 weeks. Provider is aware.    Xolair  150 mg/ml: 0 doses of medicine on hand     REFERRAL TO PHARMACIST     Referral to the pharmacist: Not needed      [facility]     Shipping address confirmed in Epic.     Cost and Payment: Patient has a $0 copay, payment information is not required.    Delivery Scheduled: Yes, Expected medication delivery date: 11/15/23.     Medication will be delivered via UPS to the prescription address in Epic WAM.    Peter Santiago, PharmD   [facility] and Home Delivery Pharm[facility]  Specialty Pharmacist

## 2023-11-14 MED FILL — XOLAIR 150 MG/ML SUBCUTANEOUS SYRINGE: SUBCUTANEOUS | 28 days supply | Qty: 2 | Fill #2

## 2023-12-07 NOTE — Unmapped (Signed)
 [facility] Specialty and Home Delivery Pharmacy Refill Coordination Note    Peter Santiago, Marty: [birth year]  Phone: [phone] (home) [phone] (work)      All above HIPAA information was verified with patient.         12/05/2023     8:55 PM   Specialty Rx Medication Refill Questionnaire   Which Medications would you like refilled and shipped? Xolair    Please list all current allergies: none   Have you missed any doses in the last 30 days? No   Have you had any changes to your medication(s) since your last refill? No   How many days remaining of each medication do you have at home? 1 dose   If receiving an injectable medication, next injection date is 12/15/2023   Have you experienced any side effects in the last 30 days? No   Please enter the full address (street address, city, state, zip code) where you would like your medication(s) to be delivered to. [address], [place], [name]  [address], [place]    Please specify on which day you would like your medication(s) to arrive. Note: if you need your medication(s) within 3 days, please call the pharmacy to schedule your order at [phone]  12/19/2023   Has your insurance changed since your last refill? No   Would you like a pharmacist to call you to discuss your medication(s)? No   Do you require a signature for your package? (Note: if we are billing Medicare Part B or your order contains a controlled substance, we will require a signature) No   I have been provided my out of pocket cost for my medication and approve the pharmacy to charge the amount to my credit card on file. Yes         Completed refill call assessment today to schedule patient's medication shipment from the [facility] and [facility] Delivery Pharmacy [phone]).  All relevant notes have been reviewed.       Confirmed patient received a Conservation officer, historic buildings and a Surveyor, mining with first shipment. The patient will receive a drug information handout for each medication shipped and additional FDA Medication Guides as required.         REFERRAL TO PHARMACIST     Referral to the pharmacist: Not needed      [facility]     Shipping address confirmed in Epic.     Delivery Scheduled: Yes, Expected medication delivery date: 6/3.     Medication will be delivered via UPS to the prescription address in Epic WAM.    Pearson Bounds, PharmD   [facility] Special[facility] and Home Delivery Pharmacy Specialty Pharmacist

## 2023-12-18 MED FILL — XOLAIR 150 MG/ML SUBCUTANEOUS SYRINGE: SUBCUTANEOUS | 28 days supply | Qty: 2 | Fill #3

## 2024-01-25 NOTE — Unmapped (Signed)
 [facility] Refill Coordination Note    Peter Santiago, [place]: [birth year]  Phone: [phone] (home) [phone] (work)      All above HIPAA information was verified with patient.         01/25/2024    10:45 AM   Specialty Rx Medication Refill Questionnaire   Which Medications would you like refilled and shipped? Xolair , none currently on hand   Please list all current allergies: none   Have you missed any doses in the last 30 days? No   Have you had any changes to your medication(s) since your last refill? No   How much of each medication do you have remaining at home? (eg. number of tablets, injections, etc.) none   If receiving an injectable medication, next injection date is 02/16/2024   Have you experienced any side effects in the last 30 days? No   Please enter the full address (street address, city, state, zip code) where you would like your medication(s) to be delivered to. [address] [place], [name] [address]   Please specify on which day you would like your medication(s) to arrive. Note: if you need your medication(s) within 3 days, please call the pharmacy to schedule your order at [phone]  02/06/2024   Has your insurance changed since your last refill? No   Would you like a pharmacist to call you to discuss your medication(s)? No   Do you require a signature for your package? (Note: if we are billing Medicare Part B or your order contains a controlled substance, we will require a signature) No   I have been provided my out of pocket cost for my medication and approve the pharmacy to charge the amount to my credit card on file. Yes         Completed refill call assessment today to schedule patient's medication shipment from the [facility] [phone]).  All relevant notes have been reviewed.       Confirmed patient received a Conservation officer, historic buildings and a Surveyor, mining with first shipment. The patient will receive a drug information handout for each medication shipped and additional FDA Medication Guides as required.         REFERRAL TO PHARMACIST     Referral to the pharmacist: Not needed      [facility]     Shipping address confirmed in Epic.     Delivery Scheduled: Yes, Expected medication delivery date: 02/06/24.     Medication will be delivered via UPS to the prescription address in Epic WAM.    Peter Santiago   [facility] Specialty Technician

## 2024-02-05 MED FILL — XOLAIR 150 MG/ML SUBCUTANEOUS SYRINGE: SUBCUTANEOUS | 28 days supply | Qty: 2 | Fill #4

## 2024-02-23 ENCOUNTER — Encounter: Payer: Self-pay | Admitting: Family Medicine

## 2024-02-23 ENCOUNTER — Ambulatory Visit (INDEPENDENT_AMBULATORY_CARE_PROVIDER_SITE_OTHER): Payer: BC Managed Care – PPO | Admitting: Family Medicine

## 2024-02-23 VITALS — BP 116/60 | HR 74 | Temp 98.2°F | Resp 17 | Ht 72.25 in | Wt 213.2 lb

## 2024-02-23 DIAGNOSIS — Z0000 Encounter for general adult medical examination without abnormal findings: Secondary | ICD-10-CM

## 2024-02-23 DIAGNOSIS — Z23 Encounter for immunization: Secondary | ICD-10-CM | POA: Diagnosis not present

## 2024-02-23 DIAGNOSIS — E785 Hyperlipidemia, unspecified: Secondary | ICD-10-CM

## 2024-02-23 DIAGNOSIS — Z1159 Encounter for screening for other viral diseases: Secondary | ICD-10-CM | POA: Diagnosis not present

## 2024-02-23 DIAGNOSIS — Z125 Encounter for screening for malignant neoplasm of prostate: Secondary | ICD-10-CM

## 2024-02-23 DIAGNOSIS — Z130 Encounter for screening for diseases of the blood and blood-forming organs and certain disorders involving the immune mechanism: Secondary | ICD-10-CM

## 2024-02-23 DIAGNOSIS — Z131 Encounter for screening for diabetes mellitus: Secondary | ICD-10-CM | POA: Diagnosis not present

## 2024-02-23 LAB — CBC
HCT: 48.9 % (ref 39.0–52.0)
Hemoglobin: 16.2 g/dL (ref 13.0–17.0)
MCHC: 33.2 g/dL (ref 30.0–36.0)
MCV: 89.1 fl (ref 78.0–100.0)
Platelets: 231 K/uL (ref 150.0–400.0)
RBC: 5.49 Mil/uL (ref 4.22–5.81)
RDW: 13.2 % (ref 11.5–15.5)
WBC: 4.5 K/uL (ref 4.0–10.5)

## 2024-02-23 LAB — COMPREHENSIVE METABOLIC PANEL WITH GFR
ALT: 28 U/L (ref 0–53)
AST: 21 U/L (ref 0–37)
Albumin: 4.6 g/dL (ref 3.5–5.2)
Alkaline Phosphatase: 66 U/L (ref 39–117)
BUN: 17 mg/dL (ref 6–23)
CO2: 28 meq/L (ref 19–32)
Calcium: 9.5 mg/dL (ref 8.4–10.5)
Chloride: 103 meq/L (ref 96–112)
Creatinine, Ser: 0.99 mg/dL (ref 0.40–1.50)
GFR: 83.69 mL/min (ref >60.00)
Glucose, Bld: 91 mg/dL (ref 70–99)
Potassium: 4.5 meq/L (ref 3.5–5.1)
Sodium: 140 meq/L (ref 135–145)
Total Bilirubin: 0.8 mg/dL (ref 0.2–1.2)
Total Protein: 6.9 g/dL (ref 6.0–8.3)

## 2024-02-23 LAB — LIPID PANEL
Cholesterol: 164 mg/dL (ref 0–200)
HDL: 43.6 mg/dL (ref >39.00)
LDL Cholesterol: 100 mg/dL — ABNORMAL HIGH (ref 0–99)
NonHDL: 120.58
Total CHOL/HDL Ratio: 4
Triglycerides: 104 mg/dL (ref 0.0–149.0)
VLDL: 20.8 mg/dL (ref 0.0–40.0)

## 2024-02-23 LAB — HEMOGLOBIN A1C: Hgb A1c MFr Bld: 6 % (ref 4.6–6.5)

## 2024-02-23 LAB — PSA: PSA: 0.61 ng/mL (ref 0.10–4.00)

## 2024-02-23 MED ORDER — ATORVASTATIN CALCIUM 20 MG PO TABS
ORAL_TABLET | ORAL | 3 refills | Status: AC
Start: 2024-02-23 — End: ?

## 2024-02-23 NOTE — Patient Instructions (Signed)
 Thanks for coming in today.  If any concerns on your labs I will let you know.  I will check your 59-month blood sugar test and if it is elevated we will likely need to follow-up in 6 months after watching diet/exercise.  Otherwise follow-up in 1 year.  Let me know if there are questions in the meantime.  Take care!  Preventive Care 59-59 Years Old, Male Preventive care refers to lifestyle choices and visits with your health care provider that can promote health and wellness. Preventive care visits are also called wellness exams. What can I expect for my preventive care visit? Counseling During your preventive care visit, your health care provider may ask about your: Medical history, including: Past medical problems. Family medical history. Current health, including: Emotional well-being. Home life and relationship well-being. Sexual activity. Lifestyle, including: Alcohol, nicotine or tobacco, and drug use. Access to firearms. Diet, exercise, and sleep habits. Safety issues such as seatbelt and bike helmet use. Sunscreen use. Work and work Astronomer. Physical exam Your health care provider will check your: Height and weight. These may be used to calculate your BMI (body mass index). BMI is a measurement that tells if you are at a healthy weight. Waist circumference. This measures the distance around your waistline. This measurement also tells if you are at a healthy weight and may help predict your risk of certain diseases, such as type 2 diabetes and high blood pressure. Heart rate and blood pressure. Body temperature. Skin for abnormal spots. What immunizations do I need?  Vaccines are usually given at various ages, according to a schedule. Your health care provider will recommend vaccines for you based on your age, medical history, and lifestyle or other factors, such as travel or where you work. What tests do I need? Screening Your health care provider may recommend screening  tests for certain conditions. This may include: Lipid and cholesterol levels. Diabetes screening. This is done by checking your blood sugar (glucose) after you have not eaten for a while (fasting). Hepatitis B test. Hepatitis C test. HIV (human immunodeficiency virus) test. STI (sexually transmitted infection) testing, if you are at risk. Lung cancer screening. Prostate cancer screening. Colorectal cancer screening. Talk with your health care provider about your test results, treatment options, and if necessary, the need for more tests. Follow these instructions at home: Eating and drinking  Eat a diet that includes fresh fruits and vegetables, whole grains, lean protein, and low-fat dairy products. Take vitamin and mineral supplements as recommended by your health care provider. Do not drink alcohol if your health care provider tells you not to drink. If you drink alcohol: Limit how much you have to 0-2 drinks a day. Know how much alcohol is in your drink. In the [place] one drink equals one 12 oz bottle of beer (355 mL), one 5 oz glass of wine (148 mL), or one 1 oz glass of hard liquor (44 mL). Lifestyle Brush your teeth every morning and night with fluoride toothpaste. Floss one time each day. Exercise for at least 30 minutes 5 or more days each week. Do not use any products that contain nicotine or tobacco. These products include cigarettes, chewing tobacco, and vaping devices, such as e-cigarettes. If you need help quitting, ask your health care provider. Do not use drugs. If you are sexually active, practice safe sex. Use a condom or other form of protection to prevent STIs. Take aspirin only as told by your health care provider. Make sure that you  understand how much to take and what form to take. Work with your health care provider to find out whether it is safe and beneficial for you to take aspirin daily. Find healthy ways to manage stress, such as: Meditation, yoga, or listening  to music. Journaling. Talking to a trusted person. Spending time with friends and family. Minimize exposure to UV radiation to reduce your risk of skin cancer. Safety Always wear your seat belt while driving or riding in a vehicle. Do not drive: If you have been drinking alcohol. Do not ride with someone who has been drinking. When you are tired or distracted. While texting. If you have been using any mind-altering substances or drugs. Wear a helmet and other protective equipment during sports activities. If you have firearms in your house, make sure you follow all gun safety procedures. What's next? Go to your health care provider once a year for an annual wellness visit. Ask your health care provider how often you should have your eyes and teeth checked. Stay up to date on all vaccines. This information is not intended to replace advice given to you by your health care provider. Make sure you discuss any questions you have with your health care provider. Document Revised: 12/30/2020 Document Reviewed: 12/30/2020 Elsevier Patient Education  2024 [employer].

## 2024-02-23 NOTE — Progress Notes (Signed)
 Subjective:  Patient ID: Stephen Obrien, male    DOB: Nov 22, 1965  Age: 58 y.o. MRN: [id number]  CC:  Chief Complaint  Patient presents with   Annual Exam    Pt is well, no concerns, pt is fasting     HPI Stephen Obrien presents for Annual Exam.  No health changes since last visit.   PCP, me Allergist, Dr. Frutoso, allergic urticaria, treated with Xyzal , Xolair , appt next week.  Dermatology, Dr. Lynnell, nummular dermatitis, seborrheic keratoses, actinic keratosis. Appt next Friday.  Audiology, [facility] with history of tinnitus  OSA Mild symptoms, off CPAP previously.  Symptoms improved after weight loss.  Well rested, no daytime somnolence.sleeping well.   Hyperlipidemia: Lipitor 20 mg daily. No new myalgias/side effects. On for many years.  Lab Results  Component Value Date   CHOL 141 02/22/2023   HDL 45.30 02/22/2023   LDLCALC 80 02/22/2023   TRIG 77.0 02/22/2023   CHOLHDL 3 02/22/2023   Lab Results  Component Value Date   ALT 20 02/22/2023   AST 19 02/22/2023   ALKPHOS 63 02/22/2023   BILITOT 0.8 02/22/2023         02/23/2024    8:02 AM 02/22/2023    8:06 AM 02/02/2022   10:18 AM 12/07/2021   10:36 AM 11/26/2020    9:01 AM  Depression screen PHQ 2/9  Decreased Interest 0 0 0 0 0  Down, Depressed, Hopeless 0 0 0 0 0  PHQ - 2 Score 0 0 0 0 0  Altered sleeping 0 0  0   Tired, decreased energy 0 0  0   Change in appetite 0 0  0   Feeling bad or failure about yourself  0 0  0   Trouble concentrating 0 0  0   Moving slowly or fidgety/restless 0 0  0   Suicidal thoughts 0 0  0   PHQ-9 Score 0 0  0   Difficult doing work/chores Not difficult at all   Not difficult at all     Health Maintenance  Topic Date Due   Hepatitis B Vaccines (1 of 3 - 19+ 3-dose series) Never done   Pneumococcal Vaccine: 50+ Years (1 of 1 - PCV) Never done   COVID-19 Vaccine (5 - 2024-25 season) 03/19/2023   INFLUENZA VACCINE  02/16/2024   Colonoscopy  01/20/2026    DTaP/Tdap/Td (3 - Td or Tdap) 11/07/2027   Hepatitis C Screening  Completed   HIV Screening  Completed   Zoster Vaccines- Shingrix  Completed   HPV VACCINES  Aged Out   Meningococcal B Vaccine  Aged Out  Colonoscopy July 2017, Dr. Albertus, diverticulosis, internal hemorrhoids repeat 10 years. Prostate: does not have family history of prostate cancer The natural history of prostate cancer and ongoing controversy regarding screening and potential treatment outcomes of prostate cancer has been discussed with the patient. The meaning of a false positive PSA and a false negative PSA has been discussed. He indicates understanding of the limitations of this screening test and wishes to proceed with screening PSA testing. Lab Results  Component Value Date   PSA1 0.7 11/19/2019   PSA1 0.6 11/15/2018   PSA1 0.7 11/06/2017   PSA 0.62 02/22/2023   PSA 0.48 02/02/2022   PSA 0.52 11/26/2020    Immunization History  Administered Date(s) Administered   Influenza Inj Mdck Quad Pf 08/25/2018   Influenza, High Dose Seasonal PF 05/03/2016, 12/13/2019   Influenza, Seasonal, Injecte, Preservative Fre 06/12/2012  Influenza,inj,Quad PF,6+ Mos 09/06/2013, 09/09/2014, 09/10/2015, 04/01/2019, 05/27/2020   Influenza-Unspecified 05/30/2021, 07/08/2022, 04/21/2023   PFIZER(Purple Top)SARS-COV-2 Vaccination 09/20/2019, 10/25/2019, 12/13/2019, 06/03/2020   Tdap 04/24/2007, 11/06/2017   Zoster Recombinant(Shingrix) 03/30/2018, 08/25/2018  Prevnar today.  Flu vaccine in fall.  Hep B vaccine - unknown - titer.   No results found. Optho - appt soon.   Dental: yesterday - every 6 months.   Alcohol:8-10 glasses per week.   Tobacco: none  Exercise: walking, fast pace, jump rope, yoga. Min resistance exercise otherwise. Travel makes difficult for exercise, and food choices.  Borderline A1c prior.  Wt Readings from Last 3 Encounters:  02/23/24 213 lb 3.2 oz (96.7 kg)  02/22/23 200 lb 6.4 oz (90.9 kg)  02/02/22  202 lb 3.2 oz (91.7 kg)      History Patient Active Problem List   Diagnosis Date Noted   Bilateral hearing loss due to cerumen impaction 03/04/2021   OSA on CPAP 11/21/2018   Tinnitus of both ears 05/15/2017   Allergic urticaria 09/06/2013   Other and unspecified hyperlipidemia 09/06/2013   Past Medical History:  Diagnosis Date   Allergy    Anxiety    Arthritis    Hyperlipidemia    OSA on CPAP 11/21/2018   Past Surgical History:  Procedure Laterality Date   DISTAL BICEPS TENDON REPAIR  06/2018   TEAR DUCT PROBING  1968   VASECTOMY     WISDOM TOOTH EXTRACTION  1983   No Known Allergies Prior to Admission medications   Medication Sig Start Date End Date Taking? Authorizing Provider  atorvastatin  (LIPITOR) 20 MG tablet TAKE 1 TABLET(20 MG) BY MOUTH DAILY 02/14/23  Yes Levora Reyes SAUNDERS, MD  levocetirizine (XYZAL ) 5 MG tablet Take 1 tablet (5 mg total) by mouth every evening. 09/10/15  Yes Daub, Elspeth LABOR, MD  Omalizumab  (XOLAIR  [place]) Inject 150 mg into the skin.    Yes [provider]   Social History   Socioeconomic History   Marital status: Married    Spouse name: Not on file   Number of children: Not on file   Years of education: Not on file   Highest education level: Master's degree (e.g., MA, MS, MEng, MEd, MSW, MBA)  Occupational History   Not on file  Tobacco Use   Smoking status: Never   Smokeless tobacco: Never  Vaping Use   Vaping status: Never Used  Substance and Sexual Activity   Alcohol use: Yes    Alcohol/week: 6.0 standard drinks of alcohol    Types: 6 Standard drinks or equivalent per week    Comment: wine   Drug use: No   Sexual activity: Yes  Other Topics Concern   Not on file  Social History Narrative   Not on file   Social Drivers of Health   Financial Resource Strain: Low Risk  (02/19/2024)   Overall Financial Resource Strain (CARDIA)    Difficulty of Paying Living Expenses: Not hard at all  Food Insecurity: No Food Insecurity  (02/19/2024)   Hunger Vital Sign    Worried About Running Out of Food in the Last Year: Never true    Ran Out of Food in the Last Year: Never true  Transportation Needs: No Transportation Needs (02/19/2024)   PRAPARE - Administrator, Civil Service (Medical): No    Lack of Transportation (Non-Medical): No  Physical Activity: Sufficiently Active (02/19/2024)   Exercise Vital Sign    Days of Exercise per Week: 4 days  Minutes of Exercise per Session: 40 min  Stress: No Stress Concern Present (02/19/2024)   [employer] of Occupational Health - Occupational Stress Questionnaire    Feeling of Stress: Only a little  Social Connections: Socially Integrated (02/19/2024)   Social Connection and Isolation Panel    Frequency of Communication with Friends and Family: Twice a week    Frequency of Social Gatherings with Friends and Family: Three times a week    Attends Religious Services: 1 to 4 times per year    Active Member of Clubs or Organizations: Yes    Attends Engineer, structural: More than 4 times per year    Marital Status: Married  Catering manager Violence: Not on file    Review of Systems 13 point review of systems per patient health survey noted.  Negative other than as indicated above or in HPI.    Objective:   Vitals:   02/23/24 0801  BP: 116/60  Pulse: 74  Resp: 17  Temp: 98.2 F (36.8 C)  TempSrc: Temporal  SpO2: 96%  Weight: 213 lb 3.2 oz (96.7 kg)  Height: 6' 0.25 (1.835 m)     Physical Exam Vitals reviewed.  Constitutional:      Appearance: He is well-developed.  HENT:     Head: Normocephalic and atraumatic.     Right Ear: External ear normal.     Left Ear: External ear normal.  Eyes:     Conjunctiva/sclera: Conjunctivae normal.     Pupils: Pupils are equal, round, and reactive to light.  Neck:     Thyroid : No thyromegaly.  Cardiovascular:     Rate and Rhythm: Normal rate and regular rhythm.     Heart sounds: Normal heart sounds.   Pulmonary:     Effort: Pulmonary effort is normal. No respiratory distress.     Breath sounds: Normal breath sounds. No wheezing.  Abdominal:     General: There is no distension.     Palpations: Abdomen is soft.     Tenderness: There is no abdominal tenderness.  Musculoskeletal:        General: No tenderness. Normal range of motion.     Cervical back: Normal range of motion and neck supple.  Lymphadenopathy:     Cervical: No cervical adenopathy.  Skin:    General: Skin is warm and dry.  Neurological:     Mental Status: He is alert and oriented to person, place, and time.     Deep Tendon Reflexes: Reflexes are normal and symmetric.  Psychiatric:        Behavior: Behavior normal.        Assessment & Plan:  Stephen Obrien is a 58 y.o. male . Annual physical exam  - -anticipatory guidance as below in AVS, screening labs above. Health maintenance items as above in HPI discussed/recommended as applicable.   Need for hepatitis B screening test - Plan: Hepatitis B surface antibody,qualitative  Need for vaccination against Streptococcus pneumoniae - Plan: Pneumococcal conjugate vaccine 20-valent (Prevnar 20)  Screening for prostate cancer - Plan: PSA  Screening for diabetes mellitus - Plan: Hemoglobin A1c  Screening, anemia, deficiency, iron - Plan: CBC  Hyperlipidemia, unspecified hyperlipidemia type - Plan: Comprehensive metabolic panel with GFR, Lipid panel, atorvastatin  (LIPITOR) 20 MG tablet  - Stable, tolerating current regimen. Medications refilled. Labs pending as above.    Meds ordered this encounter  Medications   atorvastatin  (LIPITOR) 20 MG tablet    Sig: TAKE 1 TABLET(20 MG) BY MOUTH DAILY  Dispense:  90 tablet    Refill:  3   Patient Instructions  Thanks for coming in today.  If any concerns on your labs I will let you know.  I will check your 58-month blood sugar test and if it is elevated we will likely need to follow-up in 6 months after watching  diet/exercise.  Otherwise follow-up in 1 year.  Let me know if there are questions in the meantime.  Take care!  Preventive Care 4-43 Years Old, Male Preventive care refers to lifestyle choices and visits with your health care provider that can promote health and wellness. Preventive care visits are also called wellness exams. What can I expect for my preventive care visit? Counseling During your preventive care visit, your health care provider may ask about your: Medical history, including: Past medical problems. Family medical history. Current health, including: Emotional well-being. Home life and relationship well-being. Sexual activity. Lifestyle, including: Alcohol, nicotine or tobacco, and drug use. Access to firearms. Diet, exercise, and sleep habits. Safety issues such as seatbelt and bike helmet use. Sunscreen use. Work and work Astronomer. Physical exam Your health care provider will check your: Height and weight. These may be used to calculate your BMI (body mass index). BMI is a measurement that tells if you are at a healthy weight. Waist circumference. This measures the distance around your waistline. This measurement also tells if you are at a healthy weight and may help predict your risk of certain diseases, such as type 2 diabetes and high blood pressure. Heart rate and blood pressure. Body temperature. Skin for abnormal spots. What immunizations do I need?  Vaccines are usually given at various ages, according to a schedule. Your health care provider will recommend vaccines for you based on your age, medical history, and lifestyle or other factors, such as travel or where you work. What tests do I need? Screening Your health care provider may recommend screening tests for certain conditions. This may include: Lipid and cholesterol levels. Diabetes screening. This is done by checking your blood sugar (glucose) after you have not eaten for a while  (fasting). Hepatitis B test. Hepatitis C test. HIV (human immunodeficiency virus) test. STI (sexually transmitted infection) testing, if you are at risk. Lung cancer screening. Prostate cancer screening. Colorectal cancer screening. Talk with your health care provider about your test results, treatment options, and if necessary, the need for more tests. Follow these instructions at home: Eating and drinking  Eat a diet that includes fresh fruits and vegetables, whole grains, lean protein, and low-fat dairy products. Take vitamin and mineral supplements as recommended by your health care provider. Do not drink alcohol if your health care provider tells you not to drink. If you drink alcohol: Limit how much you have to 0-2 drinks a day. Know how much alcohol is in your drink. In the [place] one drink equals one 12 oz bottle of beer (355 mL), one 5 oz glass of wine (148 mL), or one 1 oz glass of hard liquor (44 mL). Lifestyle Brush your teeth every morning and night with fluoride toothpaste. Floss one time each day. Exercise for at least 30 minutes 5 or more days each week. Do not use any products that contain nicotine or tobacco. These products include cigarettes, chewing tobacco, and vaping devices, such as e-cigarettes. If you need help quitting, ask your health care provider. Do not use drugs. If you are sexually active, practice safe sex. Use a condom or other form of protection to  prevent STIs. Take aspirin only as told by your health care provider. Make sure that you understand how much to take and what form to take. Work with your health care provider to find out whether it is safe and beneficial for you to take aspirin daily. Find healthy ways to manage stress, such as: Meditation, yoga, or listening to music. Journaling. Talking to a trusted person. Spending time with friends and family. Minimize exposure to UV radiation to reduce your risk of skin cancer. Safety Always wear  your seat belt while driving or riding in a vehicle. Do not drive: If you have been drinking alcohol. Do not ride with someone who has been drinking. When you are tired or distracted. While texting. If you have been using any mind-altering substances or drugs. Wear a helmet and other protective equipment during sports activities. If you have firearms in your house, make sure you follow all gun safety procedures. What's next? Go to your health care provider once a year for an annual wellness visit. Ask your health care provider how often you should have your eyes and teeth checked. Stay up to date on all vaccines. This information is not intended to replace advice given to you by your health care provider. Make sure you discuss any questions you have with your health care provider. Document Revised: 12/30/2020 Document Reviewed: 12/30/2020 Elsevier Patient Education  2024 [employer]    Signed,   Reyes Pines, MD [facility], [facility] 02/23/24 8:41 AM

## 2024-02-24 LAB — HEPATITIS B SURFACE ANTIBODY,QUALITATIVE: Hep B S Ab: NONREACTIVE

## 2024-02-25 ENCOUNTER — Encounter: Payer: Self-pay | Admitting: Family Medicine

## 2024-02-26 DIAGNOSIS — J3089 Other allergic rhinitis: Secondary | ICD-10-CM | POA: Diagnosis not present

## 2024-02-26 DIAGNOSIS — J301 Allergic rhinitis due to pollen: Secondary | ICD-10-CM | POA: Diagnosis not present

## 2024-02-26 DIAGNOSIS — L509 Urticaria, unspecified: Secondary | ICD-10-CM | POA: Diagnosis not present

## 2024-02-27 ENCOUNTER — Ambulatory Visit: Payer: Self-pay | Admitting: Family Medicine

## 2024-02-27 NOTE — Unmapped (Signed)
 [facility] Specialty and Home Delivery Pharmacy Refill Coordination Note    Peter Santiago, [name]: 1965/08/26  Phone: [phone] (home) [phone] (work)      All above HIPAA information was verified with patient.         02/27/2024     1:31 PM   Specialty Rx Medication Refill Questionnaire   Which Medications would you like refilled and shipped? Xolair , 0 doses on hand   Please list all current allergies: none   Have you missed any doses in the last 30 days? No   Have you had any changes to your medication(s) since your last refill? No   How much of each medication do you have remaining at home? (eg. number of tablets, injections, etc.) none   If receiving an injectable medication, next injection date is 03/16/2024   Have you experienced any side effects in the last 30 days? No   Please enter the full address (street address, city, state, zip code) where you would like your medication(s) to be delivered to. [address] [place], [name]  [address]   Please specify on which day you would like your medication(s) to arrive. Note: if you need your medication(s) within 3 days, please call the pharmacy to schedule your order at [phone]  03/06/2024   Has your insurance changed since your last refill? No   Would you like a pharmacist to call you to discuss your medication(s)? No   Do you require a signature for your package? (Note: if we are billing Medicare Part B or your order contains a controlled substance, we will require a signature) No   I have been provided my out of pocket cost for my medication and approve the pharmacy to charge the amount to my credit card on file. Yes         Completed refill call assessment today to schedule patient's medication shipment from the [facility] and Home Delivery Pharmacy [phone]).  All relevant notes have been reviewed.       Confirmed patient received a Conservation officer, historic buildings and a Surveyor, mining with first shipment. The patient will receive a drug information handout for each medication shipped and additional FDA Medication Guides as required.         REFERRAL TO PHARMACIST     Referral to the pharmacist: Not needed      [facility]     Shipping address confirmed in Epic.     Delivery Scheduled: Yes, Expected medication delivery date: 03/06/24.     Medication will be delivered via UPS to the prescription address in Epic WAM.    Peter Santiago   [facility] Specialty and Home Delivery Pharmacy Specialty Technician

## 2024-03-01 DIAGNOSIS — L821 Other seborrheic keratosis: Secondary | ICD-10-CM | POA: Diagnosis not present

## 2024-03-01 DIAGNOSIS — D2261 Melanocytic nevi of right upper limb, including shoulder: Secondary | ICD-10-CM | POA: Diagnosis not present

## 2024-03-01 DIAGNOSIS — L300 Nummular dermatitis: Secondary | ICD-10-CM | POA: Diagnosis not present

## 2024-03-01 DIAGNOSIS — L814 Other melanin hyperpigmentation: Secondary | ICD-10-CM | POA: Diagnosis not present

## 2024-03-05 MED FILL — XOLAIR 150 MG/ML SUBCUTANEOUS SYRINGE: SUBCUTANEOUS | 28 days supply | Qty: 2 | Fill #5

## 2024-03-27 NOTE — Unmapped (Signed)
 [facility] Specialty and Home Delivery Pharmacy Refill Coordination Note    Peter Santiago, [place]: 29-May-1966  Phone: [phone] (home) [phone] (work)      All above HIPAA information was verified with patient.         03/26/2024     2:49 PM   Specialty Rx Medication Refill Questionnaire   Which Medications would you like refilled and shipped? Xolair    Please list all current allergies: none   Have you missed any doses in the last 30 days? No   Have you had any changes to your medication(s) since your last refill? No   How much of each medication do you have remaining at home? (eg. number of tablets, injections, etc.) none   If receiving an injectable medication, next injection date is 04/20/2024   Have you experienced any side effects in the last 30 days? No   Please enter the full address (street address, city, state, zip code) where you would like your medication(s) to be delivered to. [address], [place], [name]  [address]   Please specify on which day you would like your medication(s) to arrive. Note: if you need your medication(s) within 3 days, please call the pharmacy to schedule your order at [phone]  04/02/2024   Has your insurance changed since your last refill? No   Would you like a pharmacist to call you to discuss your medication(s)? No   Do you require a signature for your package? (Note: if we are billing Medicare Part B or your order contains a controlled substance, we will require a signature) No   I have been provided my out of pocket cost for my medication and approve the pharmacy to charge the amount to my credit card on file. Yes         Completed refill call assessment today to schedule patient's medication shipment from the [facility] and Home Delivery Pharmacy [phone]).  All relevant notes have been reviewed.       Confirmed patient received a Conservation officer, historic buildings and a Surveyor, mining with first shipment. The patient will receive a drug information handout for each medication shipped and additional FDA Medication Guides as required.         REFERRAL TO PHARMACIST     Referral to the pharmacist: Not needed      [facility]     Shipping address confirmed in Epic.     Delivery Scheduled: Yes, Expected medication delivery date: 04/02/24.     Medication will be delivered via UPS to the prescription address in Epic WAM.    [name]   [facility] Specialty and Home Delivery Pharmacy Specialty Technician

## 2024-04-01 MED FILL — XOLAIR 150 MG/ML SUBCUTANEOUS SYRINGE: SUBCUTANEOUS | 28 days supply | Qty: 2 | Fill #6

## 2024-04-23 MED ORDER — OMALIZUMAB 150 MG/ML SUBCUTANEOUS SYRINGE
SUBCUTANEOUS | 6 refills | 0.00000 days
Start: 2024-04-23 — End: ?

## 2024-04-23 MED ORDER — XOLAIR 300 MG/2 ML SUBCUTANEOUS AUTO-INJECTOR
SUBCUTANEOUS | 5 refills | 28.00000 days
Start: 2024-04-23 — End: ?

## 2024-04-26 NOTE — Unmapped (Signed)
 [facility] Specialty and Home Delivery Pharmacy Refill Coordination Note    Peter Santiago, [place]: 08-02-1965  Phone: [phone] (home) [phone] (work)      All above HIPAA information was verified with patient.         04/23/2024     8:28 PM   Specialty Rx Medication Refill Questionnaire   Which Medications would you like refilled and shipped? 21   Please list all current allergies: none   Have you missed any doses in the last 30 days? No   Have you had any changes to your medication(s) since your last refill? No   How much of each medication do you have remaining at home? (eg. number of tablets, injections, etc.) 2 syringes   If receiving an injectable medication, next injection date is 05/14/2024   Have you experienced any side effects in the last 30 days? No   Please enter the full address (street address, city, state, zip code) where you would like your medication(s) to be delivered to. [address], [name], [name]  [address]   Please specify on which day you would like your medication(s) to arrive. Note: if you need your medication(s) within 3 days, please call the pharmacy to schedule your order at [phone]  05/07/2024   Has your insurance changed since your last refill? No   Would you like a pharmacist to call you to discuss your medication(s)? No   Do you require a signature for your package? (Note: if we are billing Medicare Part B or your order contains a controlled substance, we will require a signature) No   I have been provided my out of pocket cost for my medication and approve the pharmacy to charge the amount to my credit card on file. Yes         Completed refill call assessment today to schedule patient's medication shipment from the [facility] and Home Delivery Pharmacy [phone]).  All relevant notes have been reviewed.       Confirmed patient received a Conservation officer, historic buildings and a Surveyor, mining with first shipment. The patient will receive a drug information handout for each medication shipped and additional FDA Medication Guides as required.         REFERRAL TO PHARMACIST     Referral to the pharmacist: Not needed      [facility]     Shipping address confirmed in Epic.     Delivery Scheduled: Yes, Expected medication delivery date: 05/07/24.     Medication will be delivered via UPS to the prescription address in Epic WAM.    Peter Santiago   [facility] Specialty and Home Delivery Pharmacy Specialty Technician

## 2024-05-06 MED FILL — XOLAIR 300 MG/2 ML SUBCUTANEOUS AUTO-INJECTOR: SUBCUTANEOUS | 28 days supply | Qty: 2 | Fill #0

## 2024-05-09 MED ORDER — XOLAIR 300 MG/2 ML SUBCUTANEOUS AUTO-INJECTOR
SUBCUTANEOUS | 5 refills | 28.00000 days
Start: 2024-05-09 — End: ?

## 2024-05-28 NOTE — Progress Notes (Signed)
 [facility] Specialty and Home Delivery Pharmacy Refill Coordination Note    RILAN EILAND, [place]: 04/29/1966  Phone: [phone] (home) [phone] (work)      All above HIPAA information was verified with patient.         05/28/2024     3:59 PM   Specialty Rx Medication Refill Questionnaire   Which Medications would you like refilled and shipped? Xolair    Please list all current allergies: grass, weeds   Have you missed any doses in the last 30 days? No   Have you had any changes to your medication(s) since your last refill? No   How much of each medication do you have remaining at home? (eg. number of tablets, injections, etc.) 0   If receiving an injectable medication, next injection date is 06/09/2024   Have you experienced any side effects in the last 30 days? No   Please enter the full address (street address, city, state, zip code) where you would like your medication(s) to be delivered to. [address] [place], [name]  [address]   Please specify on which day you would like your medication(s) to arrive. Note: if you need your medication(s) within 3 days, please call the pharmacy to schedule your order at [phone]  06/04/2024   Has your insurance changed since your last refill? No   Would you like a pharmacist to call you to discuss your medication(s)? No   Do you require a signature for your package? (Note: if we are billing Medicare Part B or your order contains a controlled substance, we will require a signature) No   I have been provided my out of pocket cost for my medication and approve the pharmacy to charge the amount to my credit card on file. Yes         Completed refill call assessment today to schedule patient's medication shipment from the [facility] and Home Delivery Pharmacy [phone]).  All relevant notes have been reviewed.       Confirmed patient received a Conservation Officer, Historic Buildings and a Surveyor, Mining with first shipment. The patient will receive a drug information handout for each medication shipped and additional FDA Medication Guides as required.         REFERRAL TO PHARMACIST     Referral to the pharmacist: Not needed      [facility]     Shipping address confirmed in Epic.     Delivery Scheduled: Yes, Expected medication delivery date: 06/04/24.     Medication will be delivered via UPS to the prescription address in Epic WAM.    [name]   [facility] Specialty and Home Delivery Pharmacy Specialty Technician

## 2024-06-03 MED FILL — XOLAIR 300 MG/2 ML SUBCUTANEOUS AUTO-INJECTOR: SUBCUTANEOUS | 28 days supply | Qty: 2 | Fill #0

## 2024-06-10 DIAGNOSIS — H524 Presbyopia: Secondary | ICD-10-CM | POA: Diagnosis not present

## 2024-06-27 NOTE — Progress Notes (Signed)
 [facility] Specialty and Home Delivery Pharmacy Clinical Assessment & Refill Coordination Note    Peter Santiago, DOB: 1966-05-26  Phone: [phone] (home) [phone] (work)    All above HIPAA information was verified with patient.     Was a nurse, learning disability used for this call? No    Specialty Medication(s):   Inflammatory Disorders: Xolair      Current Medications[1]     Changes to medications: Olaf reports no changes at this time.    Medication list has been reviewed and updated in Epic: Yes    Allergies[2]    Changes to allergies: No    Allergies have been reviewed and updated in Epic: Yes    SPECIALTY MEDICATION ADHERENCE     Xolair  300 mg/68mL: 0 doses of medicine on hand     Medication Adherence    Patient reported X missed doses in the last month: 0  Specialty Medication: Xolair  300 mg/2mL Q28d  Patient is on additional specialty medications: No  Patient is on more than two specialty medications: No  Any gaps in refill history greater than 2 weeks in the last 3 months: no  Demonstrates understanding of importance of adherence: yes  Informant: patient          Specialty medication(s) dose(s) confirmed: Regimen is correct and unchanged.     Are there any concerns with adherence? No    Adherence counseling provided? Not needed    CLINICAL MANAGEMENT AND INTERVENTION      Clinical Benefit Assessment:    Do you feel the medicine is effective or helping your condition? Yes    Clinical Benefit counseling provided? Not needed    Adverse Effects Assessment:    Are you experiencing any side effects? No    Are you experiencing difficulty administering your medicine? No    Quality of Life Assessment:    Quality of Life    Rheumatology  Oncology  Dermatology  Cystic Fibrosis          How many days over the past month did your urticaria  keep you from your normal activities? For example, brushing your teeth or getting up in the morning. 0    Have you discussed this with your provider? Not needed    Acute Infection Status:    Acute infections noted within Epic:  No active infections    Patient reported infection: None    Therapy Appropriateness:    Is the medication and dose appropriate considering the patient???s diagnosis, treatment, and disease journey, comorbidities, medical history, current medications, allergies, therapeutic goals, self-administration ability, and access barriers? Yes, therapy is appropriate and should be continued     Clinical Intervention:    Was an intervention completed as part of this clinical assessment? No    DISEASE/MEDICATION-SPECIFIC INFORMATION      For patients on injectable medications: Next injection is scheduled for 12/21.    Chronic Inflammatory Diseases: Have you experienced any flares in the last month? No  Has this been reported to your provider? Not applicable    PATIENT SPECIFIC NEEDS     Does the patient have any physical, cognitive, or cultural barriers? No    Is the patient high risk? No    Does the patient require physician intervention or other additional services (i.e., nutrition, smoking cessation, social work)? No    Does the patient have an additional or emergency contact listed in their chart? Yes    SOCIAL DETERMINANTS OF HEALTH     At the [facility],  we have learned that life circumstances - like trouble affording food, housing, utilities, or transportation can affect the health of many of our patients.   That is why we wanted to ask: are you currently experiencing any life circumstances that are negatively impacting your health and/or quality of life? Patient declined to answer    Social Drivers of Health     Food Insecurity: Not on file   Tobacco Use: Not on file   Transportation Needs: Not on file   Alcohol  Use: Not on file   Housing: Not on file   Physical Activity: Not on file   Utilities: Not on file   Stress: Not on file   Interpersonal Safety: Not on file   Substance Use: Not on file (06/27/2024)   Intimate Partner Violence: Not on file   Social Connections: Not on file Financial Resource Strain: Not on file   Health Literacy: Not on file   Internet Connectivity: Not on file       Would you be willing to receive help with any of the needs that you have identified today? Not applicable       SHIPPING     Specialty Medication(s) to be Shipped:   Inflammatory Disorders: Xolair     Other medication(s) to be shipped: No additional medications requested for fill at this time    Specialty Medications not needed at this time: N/A     Changes to insurance: No    Cost and Payment: Patient has a $0 copay, payment information is not required.    Delivery Scheduled: Yes, Expected medication delivery date: 07/03/24.     Medication will be delivered via UPS to the confirmed prescription address in [facility].    The patient will receive a drug information handout for each medication shipped and additional FDA Medication Guides as required.  Verified that patient has previously received a Conservation Officer, Historic Buildings and a Surveyor, Mining.    The patient or caregiver noted above participated in the development of this care plan and knows that they can request review of or adjustments to the care plan at any time.      All of the patient's questions and concerns have been addressed.    Shelba DELENA Hummer, PharmD   [facility] Specialty and Home Delivery Pharmacy Specialty Pharmacist       [1]   Current Outpatient Medications   Medication Sig Dispense Refill    atorvastatin (LIPITOR) 20 MG tablet Take 1 tablet (20 mg total) by mouth daily.      levocetirizine (XYZAL) 5 MG tablet Take 1 tablet (5 mg total) by mouth every evening.      lutein 10 mg Tab Take 1 tablet (10 mg total) by mouth daily.      PREVIDENT 0.2 % Soln TAKE 1 CAPFUL BY MOUTH AS DIRECTED      alcohol  swabs  (ALCOHOL  PADS) PadM Use as directed 100 each 11    omalizumab  (XOLAIR ) 300 mg/2 mL auto-injector Inject one pen (300 mg total) under the skin every twenty-eight (28) days. 2 mL 5    omalizumab  (XOLAIR ) 300 mg/2 mL auto-injector Inject the contents of 1 pen (300 mg total) under the skin every twenty-eight (28) days. 2 mL 5     No current facility-administered medications for this visit.   [2] No Known Allergies

## 2024-07-02 MED FILL — XOLAIR 300 MG/2 ML SUBCUTANEOUS AUTO-INJECTOR: SUBCUTANEOUS | 28 days supply | Qty: 2 | Fill #1

## 2024-08-02 NOTE — Progress Notes (Signed)
 [facility] Specialty and Home Delivery Pharmacy Refill Coordination Note    Peter Santiago, [name]: 09/29/1965  Phone: [phone] (home) [phone] (work)      All above HIPAA information was verified with patient.         07/31/2024     2:43 PM   Specialty Rx Medication Refill Questionnaire   Which Medications would you like refilled and shipped? Xolair    Please list all current allergies: Seasonal grass and weess   Have you missed any doses in the last 30 days? No   Have you had any changes to your medication(s) since your last refill? No   How much of each medication do you have remaining at home? (eg. number of tablets, injections, etc.) 0   If receiving an injectable medication, next injection date is 08/05/2024   Have you experienced any side effects in the last 30 days? No   Please enter the full address (street address, city, state, zip code) where you would like your medication(s) to be delivered to. [address] [place], [name] [address]   Please specify on which day you would like your medication(s) to arrive. Note: if you need your medication(s) within 3 days, please call the pharmacy to schedule your order at [phone]  08/06/2024   Has your insurance changed since your last refill? No   Would you like a pharmacist to call you to discuss your medication(s)? No   Do you require a signature for your package? (Note: if we are billing Medicare Part B or your order contains a controlled substance, we will require a signature) No   I have been provided my out of pocket cost for my medication and approve the pharmacy to charge the amount to my credit card on file. Yes         Completed refill call assessment today to schedule patient's medication shipment from the [facility] and Home Delivery Pharmacy [phone]).  All relevant notes have been reviewed.       Confirmed patient received a Conservation Officer, Historic Buildings and a Surveyor, Mining with first shipment. The patient will receive a drug information handout for each medication shipped and additional FDA Medication Guides as required.         REFERRAL TO PHARMACIST     Referral to the pharmacist: Not needed      [facility]     Shipping address confirmed in Epic.     Delivery Scheduled: Yes, Expected medication delivery date: 01/21.     Medication will be delivered via UPS to the prescription address in Epic WAM.    Peter Santiago   [facility] Specialty and Home Delivery Pharmacy Specialty Technician

## 2024-08-02 NOTE — Progress Notes (Signed)
 08/02/2024 - Patient originally requested delivery for 01/20. Delivery is not possible on this date due to closed monday. I have reached out to the patient and confirmed that delivery on 01/21 is ok.

## 2024-08-05 MED FILL — XOLAIR 300 MG/2 ML SUBCUTANEOUS AUTO-INJECTOR: SUBCUTANEOUS | 28 days supply | Qty: 2 | Fill #1

## 2025-02-26 ENCOUNTER — Encounter: Admitting: Family Medicine
